# Patient Record
Sex: Male | Born: 1965 | Race: Black or African American | Hispanic: No | Smoking: Current some day smoker
Health system: Southern US, Community
[De-identification: ages and names within clinical notes are randomized; demographics above are authoritative.]

## PROBLEM LIST (undated history)

## (undated) DIAGNOSIS — R079 Chest pain, unspecified: Secondary | ICD-10-CM

## (undated) DIAGNOSIS — W3400XA Accidental discharge from unspecified firearms or gun, initial encounter: Secondary | ICD-10-CM

## (undated) DIAGNOSIS — Z720 Tobacco use: Secondary | ICD-10-CM

## (undated) HISTORY — DX: Tobacco use: Z72.0

## (undated) HISTORY — DX: Chest pain, unspecified: R07.9

---

## 2001-02-01 ENCOUNTER — Emergency Department (HOSPITAL_COMMUNITY): Admission: EM | Admit: 2001-02-01 | Discharge: 2001-02-01 | Payer: Self-pay

## 2001-03-15 ENCOUNTER — Encounter: Payer: Self-pay | Admitting: Emergency Medicine

## 2001-03-15 ENCOUNTER — Emergency Department (HOSPITAL_COMMUNITY): Admission: EM | Admit: 2001-03-15 | Discharge: 2001-03-15 | Payer: Self-pay | Admitting: Emergency Medicine

## 2004-05-13 ENCOUNTER — Emergency Department (HOSPITAL_COMMUNITY): Admission: EM | Admit: 2004-05-13 | Discharge: 2004-05-13 | Payer: Self-pay | Admitting: Emergency Medicine

## 2005-07-21 ENCOUNTER — Inpatient Hospital Stay (HOSPITAL_COMMUNITY): Admission: EM | Admit: 2005-07-21 | Discharge: 2005-07-23 | Payer: Self-pay | Admitting: Family Medicine

## 2005-07-24 ENCOUNTER — Emergency Department (HOSPITAL_COMMUNITY): Admission: EM | Admit: 2005-07-24 | Discharge: 2005-07-24 | Payer: Self-pay | Admitting: Emergency Medicine

## 2005-07-25 ENCOUNTER — Inpatient Hospital Stay (HOSPITAL_COMMUNITY): Admission: AD | Admit: 2005-07-25 | Discharge: 2005-07-27 | Payer: Self-pay | Admitting: Internal Medicine

## 2008-10-22 ENCOUNTER — Emergency Department (HOSPITAL_COMMUNITY): Admission: EM | Admit: 2008-10-22 | Discharge: 2008-10-22 | Payer: Self-pay | Admitting: Family Medicine

## 2009-01-21 ENCOUNTER — Emergency Department (HOSPITAL_COMMUNITY): Admission: EM | Admit: 2009-01-21 | Discharge: 2009-01-21 | Payer: Self-pay | Admitting: Family Medicine

## 2010-09-23 LAB — CULTURE, ROUTINE-ABSCESS

## 2010-11-03 NOTE — Op Note (Signed)
NAMESHLOIMY, MICHALSKI NO.:  [id number]   MEDICAL RECORD NO.:  [id number]          PATIENT TYPE:  INP   LOCATION:  5010                         FACILITY:  [facility]   PHYSICIAN:  Myrtie Neither, MD      DATE OF BIRTH:  1965-08-08   DATE OF PROCEDURE:  07/26/2005  DATE OF DISCHARGE:                                 OPERATIVE REPORT   PREOPERATIVE DIAGNOSIS:  Prepatellar tendon abscess, right knee.   POSTOPERATIVE DIAGNOSIS:  Prepatellar tendon abscess, right knee.   PROCEDURE:  Incision and debridement, abscess, right knee, and placement of  Penrose drain.   SURGEON:  Myrtie Neither, MD   ANESTHESIA:  General.   DESCRIPTION OF PROCEDURE:  The patient was taken to the operating room after  giving adequate preoperative medication and given general anesthesia and  intubated.  The right knee was scrubbed with Betadine scrub and painted with  Betadine solution.  Right knee was draped in a sterile manner.  Anterior  incision was made over the right knee, going through the skin and down  through subcutaneous tissue, down to the abscess.  The prepatellar bursa  synovial lining was completely resected and the abscess thoroughly debrided.  Puncture wound edges were also debrided.  After adequate and thorough  debridement and use of the Simpulse irrigation, wound closure was then done.  A quarter-inch Penrose drain was placed into the wound and wound closure was  done with 2-0 nylon.  Cultures for aerobic and anaerobic organisms were also  done.  The patient tolerated the procedure quite well and went to the  recovery room in stable and satisfactory condition.      Myrtie Neither, MD  Electronically Signed     AC/MEDQ  D:  07/26/2005  T:  07/27/2005  Job:  [id number]

## 2010-11-03 NOTE — Consult Note (Signed)
NAMESAABIR, BLYTH NO.:  [id number]   MEDICAL RECORD NO.:  [id number]          PATIENT TYPE:  INP   LOCATION:  5010                         FACILITY:  [facility]   PHYSICIAN:  Myrtie Neither, MD      DATE OF BIRTH:  1965/09/16   DATE OF CONSULTATION:  07/25/2005  DATE OF DISCHARGE:                                   CONSULTATION   REFERRING PHYSICIAN:  Dr. Virginia Rochester.   REASON FOR CONSULTATION:  Infection, right knee.   PERTINENT HISTORY:  This is a 45 year old black male who works in  [employer] with [employer] and sustained an injury to his right knee about 3  weeks and had gotten some gravel and pebbles into the anterior aspect of his  knee which he did not think very much about.  This gradually became  feverish, swollen and painful.  The patient states he took a needle and  burned the end of the needle with a match and then stuck it into his knee to  drain the fluid out, which subsequently got worse.  The patient has recently  been hospitalized for this infection and found to have MRSA and also has had  felon of the right index finger recently become infected as well.   PAST MEDICAL HISTORY:  No history of high blood pressure or diabetes.   ALLERGIES:  None known.   MEDICATIONS:  Ibuprofen.   SOCIAL HISTORY:  Occasional use of alcohol, no history of use of tobacco or  illicit drugs.   FAMILY HISTORY:  High blood pressure and diabetes.   REVIEW OF SYSTEMS:  That of history of present illness, no cardiac or  respiratory, no urinary or bowel symptoms.   PHYSICAL EXAMINATION:  GENERAL:  Alert and oriented, in no acute distress.  VITAL SIGNS:  Temperature 98.1, pulse 62, respirations 20, blood pressure  140/88.  EXTREMITIES:  Left knee:  Tender, swollen prepatellar bursa with material  exuding from puncture wound anteriorly, dead skin around the periphery of  the area.  Negative Homan's test.   IMAGING STUDIES:  The patient had MRI which demonstrated  also a meniscal  tear as well as loose bodies in the right knee, no evidence of osteomyelitis  so far.   IMPRESSION:  1.  Abscess, right knee, MRSA infection.  2.  Internal derangement with medial meniscal tear and loose bodies, right      knee.   RECOMMENDATION:  Incision and drainage and debridement of patellar tendon  bursa and Penrose drain.  Continue vancomycin IV treatment.  We will address  the meniscal tear after the infection has cleared up.      Myrtie Neither, MD  Electronically Signed     AC/MEDQ  D:  07/26/2005  T:  07/27/2005  Job:  [id number]

## 2010-11-03 NOTE — H&P (Signed)
[name], [name]               ACCOUNT NO.:  [id number]   MEDICAL RECORD NO.:  [id number]          PATIENT TYPE:  INP   LOCATION:  0103                         FACILITY:  [facility]   PHYSICIAN:  Jackie Plum, M.D.DATE OF BIRTH:  1965-10-01   DATE OF ADMISSION:  07/21/2005  DATE OF DISCHARGE:                                HISTORY & PHYSICAL   ADMISSION DIAGNOSES:  A boil on right knee.  The patient presented with 3  week history of right knee boil which had worsened over the last one week  or so .  On presentation to the ED, the ED physicians thought it was  involving the right knee joint, and therefore they direct knee aspiration,  and there was no evidence of any passive drainage or any infected drained  fluid obtained.  The patient was noted to have maximum swelling of the  affected extremity.  Orthopedics was asked to evaluate for admission after  initiation of IV antibiotic.  The patient denies any history of fever or  chills.  He had felt a little bit nauseated yesterday.  No vomiting, no  diarrhea, no headaches, no chest pain, no shortness of breath.  He denies  any previous history of heart disease, diabetes, hypertension.  Does not  have a medical doctor.  He is not allergic to any medication and is not on  any regular medications.  He does not smoke cigarettes.  He drinks alcohol  on a social basis.  Denies any illicit drug use.   FAMILY HISTORY:  Negative for heart disease or diabetes.   SOCIAL HISTORY:  The patient drinks alcohol on a social basis.   Pulmonary history is negative for heart disease or diabetes mellitus.   REVIEW OF SYSTEMS:  As noted above.  Otherwise, unremarkable.   PHYSICAL EXAMINATION:  VITAL SIGNS:  Temperature 97.3, BP 112/68 pulse 62,  respirations 18, O2 saturation 100%.  During the exam, the patient was not  acutely ill-looking.  He was comfortable, no distress from a cardiopulmonary  standpoint.  O2 saturations by pulse oximetry was  100%.  During the exam,  the patient was in moderate pain but not in acute cardiopulmonary distress.  HEENT:  Normocephalic, atraumatic.  Pupils equal, round, and reactive to  light.  Extraocular movements intact.  Oropharynx moist.  NECK:  Supple.  No JVD.  LUNGS:  Clear to auscultation.  CARDIAC:  Regular.  No gallops, no murmur.  ABDOMEN:  Soft, Bowel sounds present .  EXTREMITIES:  No cyanosis.  The patient had a 1.5 cm x 2 cm area involving  the left lower quadrant of the right knee where you could find an open wound  with serous discharge.  The whole of the right lower extremity from the knee  down to the ankle was swollen and erythematous, warm to touch, tender to  palpation.   Lab work, CBC was reviewed.  The patient had leukocytosis of 10,800 with  normal hemoglobin and hematocrit.  Platelet count was 360.  His ESR was  elevated at 23 __________/hr.  Sodium was 137, potassium 3.8, chloride 107,  CO2 26, glucose 99, BUN 8, creatinine 0.9, calcium 8.7.  UA negative for any  urinary infection.  X-ray of the affected extremity involving the joint,  i.e., knee joint was negative for any acute bony abnormality.   IMPRESSION:  Right lower extremity cellulitis.  This may have been an  extension from the previous right knee wound.  On account of the  significance of __________ swelling of the affected extremity with about 3+  pedal pitting edema, we will go ahead and obtain MRI of the extremity to  rule out deep venous thrombosis or any deep-seated infection.  The patient  was started on IV antibiotics and supportive care.      Jackie Plum, M.D.  Electronically Signed     GO/MEDQ  D:  07/21/2005  T:  07/21/2005  Job:  [id number]

## 2010-11-03 NOTE — Discharge Summary (Signed)
[name], [name]               ACCOUNT NO.:  [id number]   MEDICAL RECORD NO.:  [id number]          PATIENT TYPE:  INP   LOCATION:  5010                         FACILITY:  [facility]   PHYSICIAN:  Melissa L. Ladona Ridgel, MD  DATE OF BIRTH:  Nov 04, 1965   DATE OF ADMISSION:  07/25/2005  DATE OF DISCHARGE:  07/27/2005                                 DISCHARGE SUMMARY   ADMITTING DIAGNOSES:  1.  Infection of the right first digit, status post a recent treatment for a      right knee infection.  The patient had been admitted to the hospital      with a right knee infection.  He was discharged to home on Keflex.      Subsequent cultures grew methicillin-resistant Staphylococcus aureus.      He returned to the hospital with a new right first digit infection and      was started on vancomycin and incision and drainage completed by Dr.      Ophelia Charter from [facility], subsequently grew recurrent methicillin-      resistant Staphylococcus aureus.  The wounds were appropriately cared      for; he underwent pulse lavage and continued on his vancomycin.  He was,      at the time of discharge, placed on doxycycline 100 mg p.o. twice daily      and was to follow up with Dr. Ophelia Charter and Dr. Montez Morita as an outpatient.  2.  Infected right knee with continued suppurative discharge.  The patient      initially had been admitted prior to this admission for a right knee      infection.  He was treated with Keflex for cellulitis, but returned with      subsequent suppurative changes.  He was seen by Dr. Montez Morita and underwent      an incision and drainage of the area.  He was evaluated for intravenous      vancomycin at home.  However, it was decided that since the lesions had      been drained, they would be amenable to oral therapy; he therefore was      discharged to home on doxycycline 100 mg p.o. twice daily x2 weeks and      Percocet 5/325 mg for pain.  He was instructed to follow up with      Orthopedics for  removal of the drain and was told to wash his lesions in      Dial soap at least daily.   MEDICATIONS AT TIME OF DISCHARGE:  Medications at the time of discharge as  stated are:  1.  Doxycycline 100 mg p.o. twice daily x2 weeks.  2.  Percocet 5/325 mg one to two tablets p.o. q.6 h.  p.r.n. for pain.   FOLLOWUP:  The patient was instructed to follow up with Orthopedics and with  Dr. Ophelia Charter for reevaluation of the wound.   HISTORY OF PRESENT ILLNESS:  The patient is a [name] who was readmitted to the hospital after a previous recent admission to  the  hospital for knee cellulitis.  The patient returned with a suppurative  right first finger and continued suppurative changes to his right knee.  Cultures reviewed from the previous admission revealed methicillin-resistant  Staph aureus.  The patient had his Keflex discontinued; he was started on  Vancomycin and he had his lesions incised and drained by both Dr. Ophelia Charter and  Dr. Montez Morita from [facility].  The patient's hospital course  was unremarkable.  He tolerated the surgeries well with improvement in the  cellulitic changes.  He was subsequently discharged to home on oral  antibiotics with followup with the outpatient physicians.  On the day of  discharge, he was noted to be hemodynamically stable.   DISCHARGE PHYSICAL EXAM:  VITAL SIGNS:  Afebrile at 97.2, blood pressure  117/63, pulse of 66, respirations were 20 and saturations were 90%.  GENERAL:  He is in no acute distress.  HEENT:  His pupils were equal, round and reactive to light.  Extraocular  muscles were intact.  Mucous membranes were moist.  CHEST:  Clear to auscultation.  CARDIOVASCULAR:  Regular rate and rhythm, positive S1 and S2, no S3 or S4,  no murmurs, rubs, or gallops.  ABDOMEN:  Soft, nontender and non-distended with positive bowel sounds.  EXTREMITIES:  Extremities show an Ace wrap to his right knee and packing and   wrapping pot the right hand, winch were not undressed, but were examined by  the respective surgeons.   DISCHARGE LABORATORY DATA:  His BUN at discharge was 13; his creatinine was  1.0.  His white count at the time of discharge was 7.2.   CONDITION ON DISCHARGE:  At the time of discharge, the patient was deemed  stable to follow up to his respectively surgical physicians.      Melissa L. Ladona Ridgel, MD  Electronically Signed     MLT/MEDQ  D:  09/04/2005  T:  09/06/2005  Job:  [id number]   cc:   Myrtie Neither, MD  Fax: [phone]   Veverly Fells. Ophelia Charter, M.D.  Fax: [phone]

## 2010-11-03 NOTE — Discharge Summary (Signed)
[name], [name]               ACCOUNT NO.:  [id number]   MEDICAL RECORD NO.:  [id number]          PATIENT TYPE:  INP   LOCATION:  1618                         FACILITY:  [facility]   PHYSICIAN:  Jackie Plum, M.D.DATE OF BIRTH:  1966/04/24   DATE OF ADMISSION:  07/21/2005  DATE OF DISCHARGE:                                 DISCHARGE SUMMARY   DISCHARGE DIAGNOSES:  1.  Right lower extremity cellulitis.  2.  Mild right knee abscess.  3.  Questionable meniscal tear. The patient is planned for outpatient follow-      up with Dr. Montez Morita of orthopedics. The patient denies any recent trauma.      He has been ambulating without any significant problems, and this was      based on MRI finding.   DISCHARGE MEDICATIONS:  1.  Keflex 500 mg p.o. t.i.d.  2.  Motrin 800 mg p.o. t.i.d. p.r.n.   CONSULTANTS:  Not applicable.   PROCEDURES:  Not applicable.   CONDITION ON DISCHARGE:  Improved and satisfactory.   DISCHARGE LABORATORY DATA:  Wbc count 7.9, hemoglobin 13.2, hematocrit 40.3,  MCV 89.5, platelet count 385. Sodium 138, potassium 3.9, chloride 106,  glucose 89, BUN 10, creatinine 1.0, calcium 8.9. Joint fluid examination:  WBC 87 per cubic millimeter with 0 neutrophils, 27 lymphocytes, 73  monocytes, no crystals seen. MRI of the lower extremities indicated 3 x 2.5  x 1 cm diffuse subcutaneous edema throughout the right leg and is tender  about the knee. There was only a tiny right knee effusion. There was a  questionable tear of the posterior horn of the medial meniscus and evidence  of some subchondral edema of the posterior aspect of the left tibial plateau  which may be degenerative in origin.   REASON FOR ADMISSION:  Right lower extremity cellulitis.   The patient presented with right lower extremity swelling, pain, and warmth.  He apparently had sore on the right knee joint area prior to this. In the  emergency room the patient was seen by Dr. Devoria Albe who tried to do a  joint  aspiration and the fluid obtained did not reveal any infectious etiology for  any evidence of joint infection. However, in view of the cellulitis,  hospitalist service was asked to evaluate for admission. On admission, the  patient was alert and oriented x3. He had a swollen right lower extremity  from the knee joint down to the ankle with erythema and warm to touch. His  cardiopulmonary exam was unremarkable. His labs indicated mild leukocytosis.  X-ray of the affected extremity did not reveal any bony abnormality. He was  admitted for management of lower extremity cellulitis. He was started on IV  antibiotics. In view of his extensive swelling, we obtained an MRI of the  lower extremities to rule out DVT as well any deep-seated infection and we  obtained cuts involving the knee joint. The results indicated cellulitis  which was confirmed and also there was a questionable meniscal tear as noted  above. The patient denies any recent or any remote trauma to the joint that  he can remember. 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 This MRI report was questionable. On  rounds today the patient was significant for his leukocytosis has resolved,  he does not have significant pain involving the joint, and would like to go  home. After further discussion with the patient, we decided to cancel a  planned inpatient orthopedic evaluation and for him to be seen expeditiously  by orthopedics on-call for unassigned, Dr. Montez Morita, as an outpatient as soon  as possible. He is alert and oriented x3, no acute focal difficulties.  Cardiopulmonary exam unremarkable.   His discharge labs include blood pressure 125/79, pulse 55, respirations 16,  temperature 97.6 degrees Fahrenheit. His right lower extremity swelling and  erythema has decreased remarkably, has improved. His white count is 7.9,  hemoglobin 13.2, hematocrit 40.3, MCV 89.5, platelet  count 385. Sodium 138,  potassium 3.9, chloride 106, CO2 27, glucose 89, BUN 10, creatinine 1.0,  calcium 8.9.   He is going to be discharged home on antibiotics for a total of 10 more days  of treatment. The patient is going to be given an appointment to follow up  with Dr. Montez Morita of orthopedics at which time his MRI report will need to be  reviewed and decision as to whether this so-called questionable meniscal  tear is significant or not will be made. The patient understands the  importance of following up on this appointment. All his questions are  answered. He is being discharged home on p.o. antibiotics as noted above.      Jackie Plum, M.D.  Electronically Signed     GO/MEDQ  D:  07/23/2005  T:  07/23/2005  Job:  [id number]

## 2010-11-03 NOTE — H&P (Signed)
NAMEALONZA, [name] NO.:  [id number]   MEDICAL RECORD NO.:  [id number]          PATIENT TYPE:  EMS   LOCATION:  ED                           [facility]ACILITY:  [facility]   PHYSICIAN:  Hollice Espy, M.D.DATE OF BIRTH:  08/22/1965   DATE OF ADMISSION:  07/24/2005  DATE OF DISCHARGE:                                HISTORY & PHYSICAL   PRIMARY CARE PHYSICIAN:  None.   CONSULTATIONS:  Mark C. Ophelia Charter, M.D., orthopedic surgery.   CHIEF COMPLAINT:  Hand and finger pain.   HISTORY OF PRESENT ILLNESS:  The patient is a 45 year old African-American  male who was just discharged from [facility] service two days ago for  a right lower extremity cellulitis with community-acquired MRSA from a  previous right knee wound. He had been discharged on p.o. Keflex and had  been stable except he started having some problems with increased pain in  his right hand, most specifically his second digit. He said this had been  ongoing since when he had first come in and was suspected likely he had been  scratching at the wound which had caused a further infection of MRSA to his  hand. The patient was having a significant amount of pain and causing some  decreased ability to extend this digit. He denies any headaches, vision  changes, dysphagia, chest pain, palpitations, shortness of breath, wheeze,  cough, abdominal pain, hematuria, dysuria, constipation, diarrhea. He has no  problems with lower extremity pain or weakness or problems with his left  upper extremity.   REVIEW OF SYSTEMS:  Otherwise negative.   PAST MEDICAL HISTORY:  Recent right knee with secondary cellulitis,  community-acquired MRSA.   MEDICATIONS:  The patient had previously been on Keflex 500 milligrams p.o.  t.i.d. and Motrin 800 milligrams p.o. t.i.d.   ALLERGIES:  The patient has no known drug allergies.   SOCIAL HISTORY:  Denies any tobacco, alcohol or drug use.   FAMILY HISTORY:  Noncontributory.   PHYSICAL EXAMINATION:  VITAL SIGNS: On admission, temperature 98.7, heart  rate 82, blood pressure 151/84, respirations 20. O2 saturation 99% on room  air.  GENERAL: The patient is alert and oriented x3 in mild distress secondary to  pain in his right hand.  HEENT: Normocephalic and atraumatic. His moist mucus membranes. He has no  carotid bruits.  HEART: Regular rate and rhythm. S1 and S2.  LUNGS: Clear to auscultation bilaterally.  ABDOMEN: Soft, nontender, and nondistended. Positive bowel sounds.  EXTREMITIES: No clubbing, cyanosis, or edema. His right hand shows some  tenderness with difficulty in extension of his fingers, especially most  notably in the second digit. It had some tenderness and swelling with 1+  pitting edema.   LABORATORY DATA:  White count 10.7. No shift. H&H 13.2 and 40.1. MCV of 90,  platelet count 443,000.   ASSESSMENT/PLAN:  Cellulitis with involvement of the right second digit:  Likely community-acquired methicillin-resistant Staphylococcus aureus. We  will start the patient on IV doxycycline and isolation precautions.  Emergency room has already spoken with Dr. Ophelia Charter of Hand Surgery who will  plan on  seeing the patient in the morning.      Hollice Espy, M.D.  Electronically Signed     SKK/MEDQ  D:  07/25/2005  T:  07/25/2005  Job:  [id number]   cc:   Veverly Fells. Ophelia Charter, M.D.  Fax: [phone]

## 2016-05-29 ENCOUNTER — Emergency Department (HOSPITAL_COMMUNITY): Payer: Self-pay

## 2016-05-29 ENCOUNTER — Inpatient Hospital Stay (HOSPITAL_COMMUNITY)
Admission: EM | Admit: 2016-05-29 | Discharge: 2016-05-31 | DRG: 514 | Disposition: A | Discharge status: Home / Self Care | Payer: Self-pay | Attending: Orthopedic Surgery | Admitting: Orthopedic Surgery

## 2016-05-29 ENCOUNTER — Encounter (HOSPITAL_COMMUNITY): Payer: Self-pay

## 2016-05-29 ENCOUNTER — Encounter (HOSPITAL_COMMUNITY)
Admission: EM | Disposition: A | Discharge status: Home / Self Care | Payer: Self-pay | Source: Home / Self Care | Attending: Orthopedic Surgery

## 2016-05-29 DIAGNOSIS — S61432A Puncture wound without foreign body of left hand, initial encounter: Secondary | ICD-10-CM

## 2016-05-29 DIAGNOSIS — S62391B Other fracture of second metacarpal bone, left hand, initial encounter for open fracture: Principal | ICD-10-CM | POA: Diagnosis present

## 2016-05-29 DIAGNOSIS — Y92009 Unspecified place in unspecified non-institutional (private) residence as the place of occurrence of the external cause: Secondary | ICD-10-CM

## 2016-05-29 DIAGNOSIS — W3400XA Accidental discharge from unspecified firearms or gun, initial encounter: Secondary | ICD-10-CM

## 2016-05-29 HISTORY — PX: PERCUTANEOUS PINNING: SHX2209

## 2016-05-29 HISTORY — PX: I&D EXTREMITY: SHX5045

## 2016-05-29 SURGERY — IRRIGATION AND DEBRIDEMENT EXTREMITY
Anesthesia: General | Site: Hand | Laterality: Left

## 2016-05-29 MED ORDER — CEFAZOLIN IN D5W 1 GM/50ML IV SOLN
1.0000 g | Freq: Once | INTRAVENOUS | Status: AC
  Administered 2016-05-30: 1 g via INTRAVENOUS
  Filled 2016-05-29: qty 50

## 2016-05-29 MED ORDER — FENTANYL CITRATE (PF) 100 MCG/2ML IJ SOLN
INTRAMUSCULAR | Status: AC
Start: 2016-05-29 — End: 2016-05-29
  Filled 2016-05-29: qty 2

## 2016-05-29 MED ORDER — OXYCODONE-ACETAMINOPHEN 5-325 MG PO TABS
1.0000 | ORAL_TABLET | Freq: Once | ORAL | Status: AC
  Administered 2016-05-29: 1 via ORAL
  Filled 2016-05-29: qty 1

## 2016-05-29 MED ORDER — TETANUS-DIPHTH-ACELL PERTUSSIS 5-2.5-18.5 LF-MCG/0.5 IM SUSP
0.5000 mL | Freq: Once | INTRAMUSCULAR | Status: AC
  Administered 2016-05-29: 0.5 mL via INTRAMUSCULAR
  Filled 2016-05-29: qty 0.5

## 2016-05-29 MED ORDER — PROPOFOL 10 MG/ML IV BOLUS
INTRAVENOUS | Status: AC
  Filled 2016-05-29: qty 20

## 2016-05-29 MED ORDER — FENTANYL CITRATE (PF) 100 MCG/2ML IJ SOLN
INTRAMUSCULAR | Status: AC
  Administered 2016-05-29: 100 ug
  Filled 2016-05-29: qty 2

## 2016-05-29 MED ORDER — MIDAZOLAM HCL 2 MG/2ML IJ SOLN
INTRAMUSCULAR | Status: AC
  Filled 2016-05-29: qty 2

## 2016-05-29 SURGICAL SUPPLY — 60 items
APL SKNCLS STERI-STRIP NONHPOA (GAUZE/BANDAGES/DRESSINGS)
BANDAGE ACE 3X5.8 VEL STRL LF (GAUZE/BANDAGES/DRESSINGS) ×2 IMPLANT
BANDAGE ACE 4X5 VEL STRL LF (GAUZE/BANDAGES/DRESSINGS) ×3 IMPLANT
BANDAGE ELASTIC 3 VELCRO ST LF (GAUZE/BANDAGES/DRESSINGS) ×3 IMPLANT
BENZOIN TINCTURE PRP APPL 2/3 (GAUZE/BANDAGES/DRESSINGS) IMPLANT
BLADE SURG ROTATE 9660 (MISCELLANEOUS) IMPLANT
BNDG CONFORM 2 STRL LF (GAUZE/BANDAGES/DRESSINGS) IMPLANT
BNDG GAUZE ELAST 4 BULKY (GAUZE/BANDAGES/DRESSINGS) ×5 IMPLANT
CLOSURE WOUND 1/2 X4 (GAUZE/BANDAGES/DRESSINGS)
CORDS BIPOLAR (ELECTRODE) ×3 IMPLANT
COVER SURGICAL LIGHT HANDLE (MISCELLANEOUS) ×3 IMPLANT
CUFF TOURNIQUET SINGLE 18IN (TOURNIQUET CUFF) ×3 IMPLANT
CUFF TOURNIQUET SINGLE 24IN (TOURNIQUET CUFF) IMPLANT
DRAPE C-ARM MINI 42X72 WSTRAPS (DRAPES) ×2 IMPLANT
DRSG ADAPTIC 3X8 NADH LF (GAUZE/BANDAGES/DRESSINGS) ×3 IMPLANT
DRSG EMULSION OIL 3X3 NADH (GAUZE/BANDAGES/DRESSINGS) IMPLANT
GAUZE SPONGE 4X4 12PLY STRL (GAUZE/BANDAGES/DRESSINGS) ×3 IMPLANT
GAUZE XEROFORM 1X8 LF (GAUZE/BANDAGES/DRESSINGS) ×3 IMPLANT
GLOVE BIOGEL M 8.0 STRL (GLOVE) ×3 IMPLANT
GLOVE SS BIOGEL STRL SZ 8 (GLOVE) ×1 IMPLANT
GLOVE SUPERSENSE BIOGEL SZ 8 (GLOVE) ×2
GOWN STRL REUS W/ TWL LRG LVL3 (GOWN DISPOSABLE) ×2 IMPLANT
GOWN STRL REUS W/ TWL XL LVL3 (GOWN DISPOSABLE) ×2 IMPLANT
GOWN STRL REUS W/TWL LRG LVL3 (GOWN DISPOSABLE) ×6
GOWN STRL REUS W/TWL XL LVL3 (GOWN DISPOSABLE) ×6
HANDPIECE INTERPULSE COAX TIP (DISPOSABLE)
K-WIRE 1.1 (WIRE) ×2 IMPLANT
KIT BASIN OR (CUSTOM PROCEDURE TRAY) ×3 IMPLANT
KIT ROOM TURNOVER OR (KITS) ×3 IMPLANT
MANIFOLD NEPTUNE II (INSTRUMENTS) ×3 IMPLANT
NDL HYPO 25GX1X1/2 BEV (NEEDLE) IMPLANT
NEEDLE HYPO 25GX1X1/2 BEV (NEEDLE) ×3 IMPLANT
NS IRRIG 1000ML POUR BTL (IV SOLUTION) ×3 IMPLANT
PACK ORTHO EXTREMITY (CUSTOM PROCEDURE TRAY) ×3 IMPLANT
PAD ARMBOARD 7.5X6 YLW CONV (MISCELLANEOUS) ×6 IMPLANT
PAD CAST 4YDX4 CTTN HI CHSV (CAST SUPPLIES) ×1 IMPLANT
PADDING CAST ABS 4INX4YD NS (CAST SUPPLIES) ×2
PADDING CAST ABS COTTON 4X4 ST (CAST SUPPLIES) IMPLANT
PADDING CAST COTTON 4X4 STRL (CAST SUPPLIES) ×3
PUTTY DBM STAGRAFT PLUS 5CC (Putty) ×2 IMPLANT
SET CYSTO W/LG BORE CLAMP LF (SET/KITS/TRAYS/PACK) ×2 IMPLANT
SET HNDPC FAN SPRY TIP SCT (DISPOSABLE) IMPLANT
SPLINT FIBERGLASS 4X30 (CAST SUPPLIES) ×2 IMPLANT
SPONGE GAUZE 4X4 12PLY STER LF (GAUZE/BANDAGES/DRESSINGS) ×2 IMPLANT
SPONGE LAP 4X18 X RAY DECT (DISPOSABLE) ×3 IMPLANT
STRIP CLOSURE SKIN 1/2X4 (GAUZE/BANDAGES/DRESSINGS) IMPLANT
SUT CHROMIC 4 0 PS 2 18 (SUTURE) ×2 IMPLANT
SUT ETHILON 4 0 P 3 18 (SUTURE) IMPLANT
SUT ETHILON 5 0 P 3 18 (SUTURE)
SUT NYLON ETHILON 5-0 P-3 1X18 (SUTURE) IMPLANT
SUT PROLENE 4 0 P 3 18 (SUTURE) IMPLANT
SUT PROLENE 4 0 PS 2 18 (SUTURE) ×2 IMPLANT
SYR CONTROL 10ML LL (SYRINGE) ×2 IMPLANT
TOWEL OR 17X24 6PK STRL BLUE (TOWEL DISPOSABLE) ×3 IMPLANT
TOWEL OR 17X26 10 PK STRL BLUE (TOWEL DISPOSABLE) ×3 IMPLANT
TUBE ANAEROBIC SPECIMEN COL (MISCELLANEOUS) IMPLANT
TUBE CONNECTING 12'X1/4 (SUCTIONS) ×1
TUBE CONNECTING 12X1/4 (SUCTIONS) ×2 IMPLANT
WATER STERILE IRR 1000ML POUR (IV SOLUTION) ×3 IMPLANT
YANKAUER SUCT BULB TIP NO VENT (SUCTIONS) ×3 IMPLANT
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
## 2016-05-29 NOTE — ED Notes (Addendum)
Nurse will draw labs. 

## 2016-05-30 ENCOUNTER — Emergency Department (HOSPITAL_COMMUNITY): Payer: Self-pay | Admitting: Certified Registered"

## 2016-05-30 ENCOUNTER — Encounter (HOSPITAL_COMMUNITY): Payer: Self-pay | Admitting: Orthopedic Surgery

## 2016-05-30 DIAGNOSIS — W3400XA Accidental discharge from unspecified firearms or gun, initial encounter: Secondary | ICD-10-CM

## 2016-05-30 DIAGNOSIS — S61432A Puncture wound without foreign body of left hand, initial encounter: Secondary | ICD-10-CM

## 2016-05-30 LAB — CBC WITH DIFFERENTIAL/PLATELET
Basophils Absolute: 0 10*3/uL (ref 0.0–0.1)
Basophils Relative: 0 %
EOS PCT: 0 %
Eosinophils Absolute: 0 10*3/uL (ref 0.0–0.7)
HCT: 36.3 % — ABNORMAL LOW (ref 39.0–52.0)
HEMOGLOBIN: 12.1 g/dL — AB (ref 13.0–17.0)
LYMPHS ABS: 1.7 10*3/uL (ref 0.7–4.0)
LYMPHS PCT: 19 %
MCH: 29.4 pg (ref 26.0–34.0)
MCHC: 33.3 g/dL (ref 30.0–36.0)
MCV: 88.3 fL (ref 78.0–100.0)
MONOS PCT: 4 %
Monocytes Absolute: 0.4 10*3/uL (ref 0.1–1.0)
Neutro Abs: 6.8 10*3/uL (ref 1.7–7.7)
Neutrophils Relative %: 77 %
PLATELETS: 267 10*3/uL (ref 150–400)
RBC: 4.11 MIL/uL — AB (ref 4.22–5.81)
RDW: 14.3 % (ref 11.5–15.5)
WBC: 8.9 10*3/uL (ref 4.0–10.5)

## 2016-05-30 LAB — I-STAT CHEM 8, ED
BUN: 16 mg/dL (ref 6–20)
CHLORIDE: 101 mmol/L (ref 101–111)
Calcium, Ion: 1.13 mmol/L — ABNORMAL LOW (ref 1.15–1.40)
Creatinine, Ser: 0.9 mg/dL (ref 0.61–1.24)
GLUCOSE: 82 mg/dL (ref 65–99)
HCT: 38 % — ABNORMAL LOW (ref 39.0–52.0)
Hemoglobin: 12.9 g/dL — ABNORMAL LOW (ref 13.0–17.0)
POTASSIUM: 3.5 mmol/L (ref 3.5–5.1)
Sodium: 139 mmol/L (ref 135–145)
TCO2: 24 mmol/L (ref 0–100)

## 2016-05-30 MED ORDER — LACTATED RINGERS IV SOLN
INTRAVENOUS | Status: DC
  Administered 2016-05-30: 21:00:00 via INTRAVENOUS
  Administered 2016-05-30: 75 mL/h via INTRAVENOUS

## 2016-05-30 MED ORDER — VITAMIN C 500 MG PO TABS
1000.0000 mg | ORAL_TABLET | Freq: Every day | ORAL | Status: DC
  Administered 2016-05-30: 1000 mg via ORAL
  Filled 2016-05-30: qty 2

## 2016-05-30 MED ORDER — ONDANSETRON HCL 4 MG PO TABS
4.0000 mg | ORAL_TABLET | Freq: Four times a day (QID) | ORAL | Status: DC | PRN
  Administered 2016-05-30 – 2016-05-31 (×2): 4 mg via ORAL
  Filled 2016-05-30 (×2): qty 1

## 2016-05-30 MED ORDER — FENTANYL CITRATE (PF) 100 MCG/2ML IJ SOLN
25.0000 ug | INTRAMUSCULAR | Status: DC | PRN
Start: 2016-05-30 — End: 2016-05-30
  Administered 2016-05-30 (×2): 50 ug via INTRAVENOUS

## 2016-05-30 MED ORDER — DOCUSATE SODIUM 100 MG PO CAPS
100.0000 mg | ORAL_CAPSULE | Freq: Two times a day (BID) | ORAL | Status: DC
  Administered 2016-05-30 (×2): 100 mg via ORAL
  Filled 2016-05-30 (×2): qty 1

## 2016-05-30 MED ORDER — FENTANYL CITRATE (PF) 100 MCG/2ML IJ SOLN
INTRAMUSCULAR | Status: AC
  Filled 2016-05-30: qty 2

## 2016-05-30 MED ORDER — PROPOFOL 10 MG/ML IV BOLUS
INTRAVENOUS | Status: DC | PRN
  Administered 2016-05-30: 150 mg via INTRAVENOUS
  Administered 2016-05-30: 50 mg via INTRAVENOUS

## 2016-05-30 MED ORDER — PROMETHAZINE HCL 25 MG RE SUPP: 12.5000 mg | Freq: Four times a day (QID) | RECTAL | Status: DC | PRN

## 2016-05-30 MED ORDER — FENTANYL CITRATE (PF) 100 MCG/2ML IJ SOLN
INTRAMUSCULAR | Status: DC | PRN
Start: 2016-05-30 — End: 2016-05-30
  Administered 2016-05-30 (×2): 50 ug via INTRAVENOUS

## 2016-05-30 MED ORDER — LIDOCAINE HCL (CARDIAC) 20 MG/ML IV SOLN
INTRAVENOUS | Status: DC | PRN
  Administered 2016-05-30: 60 mg via INTRAVENOUS

## 2016-05-30 MED ORDER — BUPIVACAINE HCL (PF) 0.25 % IJ SOLN
INTRAMUSCULAR | Status: AC
  Filled 2016-05-30: qty 30

## 2016-05-30 MED ORDER — SODIUM CHLORIDE 0.9 % IR SOLN
Status: DC | PRN
  Administered 2016-05-30: 3000 mL

## 2016-05-30 MED ORDER — METHOCARBAMOL 1000 MG/10ML IJ SOLN
500.0000 mg | Freq: Four times a day (QID) | INTRAVENOUS | Status: DC | PRN
  Administered 2016-05-30: 500 mg via INTRAVENOUS
  Filled 2016-05-30 (×2): qty 5

## 2016-05-30 MED ORDER — METHOCARBAMOL 500 MG PO TABS
500.0000 mg | ORAL_TABLET | Freq: Four times a day (QID) | ORAL | Status: DC | PRN
  Administered 2016-05-31: 500 mg via ORAL
  Filled 2016-05-30: qty 1

## 2016-05-30 MED ORDER — ONDANSETRON HCL 4 MG/2ML IJ SOLN
INTRAMUSCULAR | Status: DC | PRN
  Administered 2016-05-30: 4 mg via INTRAVENOUS

## 2016-05-30 MED ORDER — BUPIVACAINE HCL (PF) 0.25 % IJ SOLN: INTRAMUSCULAR | Status: DC | PRN

## 2016-05-30 MED ORDER — LACTATED RINGERS IV SOLN
INTRAVENOUS | Status: DC | PRN
  Administered 2016-05-30: via INTRAVENOUS

## 2016-05-30 MED ORDER — ONDANSETRON HCL 4 MG/2ML IJ SOLN
4.0000 mg | Freq: Four times a day (QID) | INTRAMUSCULAR | Status: DC | PRN
  Administered 2016-05-30: 4 mg via INTRAVENOUS
  Filled 2016-05-30: qty 2

## 2016-05-30 MED ORDER — MIDAZOLAM HCL 5 MG/5ML IJ SOLN
INTRAMUSCULAR | Status: DC | PRN
  Administered 2016-05-30: 2 mg via INTRAVENOUS

## 2016-05-30 MED ORDER — PROMETHAZINE HCL 25 MG/ML IJ SOLN: 6.2500 mg | INTRAMUSCULAR | Status: DC | PRN

## 2016-05-30 MED ORDER — ADULT MULTIVITAMIN W/MINERALS CH
1.0000 | ORAL_TABLET | Freq: Every day | ORAL | Status: DC
  Administered 2016-05-30: 1 via ORAL
  Filled 2016-05-30: qty 1

## 2016-05-30 MED ORDER — MORPHINE SULFATE (PF) 2 MG/ML IV SOLN: 1.0000 mg | INTRAVENOUS | Status: DC | PRN

## 2016-05-30 MED ORDER — OXYCODONE HCL 5 MG PO TABS
5.0000 mg | ORAL_TABLET | ORAL | Status: DC | PRN
  Administered 2016-05-30 – 2016-05-31 (×6): 10 mg via ORAL
  Filled 2016-05-30 (×6): qty 2

## 2016-05-30 MED ORDER — CLINDAMYCIN PHOSPHATE 600 MG/50ML IV SOLN
600.0000 mg | Freq: Three times a day (TID) | INTRAVENOUS | Status: DC
  Administered 2016-05-30 – 2016-05-31 (×4): 600 mg via INTRAVENOUS
  Filled 2016-05-30 (×6): qty 50

## 2016-05-30 MED ORDER — FAMOTIDINE 20 MG PO TABS
20.0000 mg | ORAL_TABLET | Freq: Two times a day (BID) | ORAL | Status: DC | PRN
Start: 2016-05-30 — End: 2016-05-31

## 2016-05-30 MED ORDER — SUCCINYLCHOLINE CHLORIDE 20 MG/ML IJ SOLN
INTRAMUSCULAR | Status: DC | PRN
  Administered 2016-05-30: 100 mg via INTRAVENOUS
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
## 2016-05-30 NOTE — Anesthesia Postprocedure Evaluation (Signed)
Anesthesia Post Note  Patient: [name]  Procedure(s) Performed: Procedure(s) (LRB): IRRIGATION AND DEBRIDEMENT EXTREMITY (Left) PERCUTANEOUS PINNING EXTREMITY (Left)  Patient location during evaluation: PACU Anesthesia Type: General Level of consciousness: awake and alert Pain management: pain level controlled Vital Signs Assessment: post-procedure vital signs reviewed and stable Respiratory status: spontaneous breathing, nonlabored ventilation, respiratory function stable and patient connected to nasal cannula oxygen Cardiovascular status: blood pressure returned to baseline and stable Postop Assessment: no signs of nausea or vomiting Anesthetic complications: no    Last Vitals:  Vitals:   05/30/16 0241 05/30/16 0300  BP: 131/88 130/88  Pulse: 68 73  Resp: 11 14  Temp: 36.6 C 36.6 C    Last Pain:  Vitals:   05/30/16 0300  TempSrc: Oral  PainSc:                  Cecile HearingStephen Edward Turk
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
## 2016-05-30 NOTE — Progress Notes (Signed)
Orthopedic Tech Progress Note Patient Details:  Richardo Priesthomas L Derstine 10/19/1965 [id number]  Ortho Devices Type of Ortho Device: Other (comment) Ortho Device/Splint Location:  arm elevator sling Ortho Device/Splint Interventions: Ordered, Application Pt already had post op splint/cast to lue.  Trinna PostMartinez, Lamona Eimer J 05/30/2016, 2:32 AM

## 2016-05-30 NOTE — Transfer of Care (Signed)
Immediate Anesthesia Transfer of Care Note  Patient: [name]  Procedure(s) Performed: Procedure(s): IRRIGATION AND DEBRIDEMENT EXTREMITY (Left) PERCUTANEOUS PINNING EXTREMITY (Left)  Patient Location: PACU  Anesthesia Type:General  Level of Consciousness: awake, alert  and oriented  Airway & Oxygen Therapy: Patient Spontanous Breathing  Post-op Assessment: Report given to RN and Post -op Vital signs reviewed and stable  Post vital signs: Reviewed and stable  Last Vitals:  Vitals:   05/29/16 2233 05/30/16 0137  BP: 108/64 137/88  Pulse: 70 98  Resp: (!) 27 (!) 21  Temp: 36.7 C 36.6 C    Last Pain:  Vitals:   05/29/16 2329  TempSrc:   PainSc: 10-Worst pain ever         Complications: No apparent anesthesia complications
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
## 2016-05-31 MED ORDER — SULFAMETHOXAZOLE-TRIMETHOPRIM 800-160 MG PO TABS: 1.0000 | ORAL_TABLET | Freq: Two times a day (BID) | ORAL | 0 refills | Status: DC

## 2016-05-31 MED ORDER — PROMETHAZINE HCL 12.5 MG PO TABS: 12.5000 mg | ORAL_TABLET | Freq: Four times a day (QID) | ORAL | 0 refills | Status: DC | PRN

## 2016-05-31 MED ORDER — METHOCARBAMOL 500 MG PO TABS: 500.0000 mg | ORAL_TABLET | Freq: Four times a day (QID) | ORAL | 0 refills | Status: DC | PRN

## 2016-05-31 MED ORDER — HYDROCODONE-ACETAMINOPHEN 5-325 MG PO TABS: 1.0000 | ORAL_TABLET | ORAL | 0 refills | Status: DC | PRN

## 2016-05-31 NOTE — Discharge Instructions (Signed)
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
## 2016-05-31 NOTE — Progress Notes (Addendum)
[name] to be D/C'd Home per MD order.  Discussed prescriptions and follow up appointments with the patient. Prescriptions given to patient, medication list explained in detail. Pt verbalized understanding.    Medication List    TAKE these medications   HYDROcodone-acetaminophen 5-325 MG tablet Commonly known as:  NORCO Take 1 tablet by mouth every 4 (four) hours as needed for moderate pain.   methocarbamol 500 MG tablet Commonly known as:  ROBAXIN Take 1 tablet (500 mg total) by mouth every 6 (six) hours as needed for muscle spasms.   promethazine 12.5 MG tablet Commonly known as:  PHENERGAN Take 1 tablet (12.5 mg total) by mouth every 6 (six) hours as needed for nausea or vomiting.   sulfamethoxazole-trimethoprim 800-160 MG tablet Commonly known as:  BACTRIM DS,SEPTRA DS Take 1 tablet by mouth 2 (two) times daily.       Vitals:   05/30/16 2000 05/31/16 0430  BP: 125/88 124/86  Pulse: (!) 59 (!) 56  Resp: 16 16  Temp: 98.4 F (36.9 C) 98.1 F (36.7 C)    Skin clean, dry and intact without evidence of skin break down, no evidence of skin tears noted. IV catheter discontinued intact. Site without signs and symptoms of complications. Dressing and pressure applied. Pt denies pain at this time. No complaints noted.  An After Visit Summary was printed and given to the patient. Patient escorted via WC, and D/C home via private auto.  Mariann BarterKellie Morse Brueggemann BSN, RN [facility] Phone [phone]

## 2017-03-18 DIAGNOSIS — W3400XA Accidental discharge from unspecified firearms or gun, initial encounter: Secondary | ICD-10-CM

## 2017-03-18 HISTORY — DX: Accidental discharge from unspecified firearms or gun, initial encounter: W34.00XA

## 2018-02-19 ENCOUNTER — Encounter (HOSPITAL_COMMUNITY): Payer: Self-pay | Admitting: Emergency Medicine

## 2018-02-19 ENCOUNTER — Emergency Department (HOSPITAL_COMMUNITY): Payer: Self-pay

## 2018-02-19 ENCOUNTER — Inpatient Hospital Stay (HOSPITAL_COMMUNITY)
Admission: EM | Admit: 2018-02-19 | Discharge: 2018-02-21 | DRG: 384 | Disposition: A | Discharge status: Home / Self Care | Payer: Self-pay | Attending: Cardiology | Admitting: Cardiology

## 2018-02-19 ENCOUNTER — Inpatient Hospital Stay (HOSPITAL_COMMUNITY)
Admission: EM | Disposition: A | Discharge status: Home / Self Care | Payer: Self-pay | Source: Home / Self Care | Attending: Cardiology

## 2018-02-19 ENCOUNTER — Other Ambulatory Visit: Payer: Self-pay

## 2018-02-19 ENCOUNTER — Observation Stay (HOSPITAL_BASED_OUTPATIENT_CLINIC_OR_DEPARTMENT_OTHER): Payer: Self-pay

## 2018-02-19 ENCOUNTER — Observation Stay (HOSPITAL_COMMUNITY): Payer: Self-pay

## 2018-02-19 DIAGNOSIS — R109 Unspecified abdominal pain: Secondary | ICD-10-CM

## 2018-02-19 DIAGNOSIS — I700 Atherosclerosis of aorta: Secondary | ICD-10-CM | POA: Diagnosis present

## 2018-02-19 DIAGNOSIS — R079 Chest pain, unspecified: Secondary | ICD-10-CM

## 2018-02-19 DIAGNOSIS — K259 Gastric ulcer, unspecified as acute or chronic, without hemorrhage or perforation: Principal | ICD-10-CM

## 2018-02-19 DIAGNOSIS — R1013 Epigastric pain: Secondary | ICD-10-CM

## 2018-02-19 DIAGNOSIS — R0789 Other chest pain: Secondary | ICD-10-CM | POA: Diagnosis present

## 2018-02-19 DIAGNOSIS — Z8249 Family history of ischemic heart disease and other diseases of the circulatory system: Secondary | ICD-10-CM

## 2018-02-19 DIAGNOSIS — R195 Other fecal abnormalities: Secondary | ICD-10-CM | POA: Diagnosis present

## 2018-02-19 DIAGNOSIS — R9431 Abnormal electrocardiogram [ECG] [EKG]: Secondary | ICD-10-CM | POA: Diagnosis present

## 2018-02-19 DIAGNOSIS — B9681 Helicobacter pylori [H. pylori] as the cause of diseases classified elsewhere: Secondary | ICD-10-CM | POA: Diagnosis present

## 2018-02-19 DIAGNOSIS — F17290 Nicotine dependence, other tobacco product, uncomplicated: Secondary | ICD-10-CM | POA: Diagnosis present

## 2018-02-19 DIAGNOSIS — K3189 Other diseases of stomach and duodenum: Secondary | ICD-10-CM | POA: Diagnosis present

## 2018-02-19 DIAGNOSIS — K269 Duodenal ulcer, unspecified as acute or chronic, without hemorrhage or perforation: Secondary | ICD-10-CM | POA: Diagnosis present

## 2018-02-19 HISTORY — PX: LEFT HEART CATH AND CORONARY ANGIOGRAPHY: CATH118249

## 2018-02-19 HISTORY — DX: Accidental discharge from unspecified firearms or gun, initial encounter: W34.00XA

## 2018-02-19 LAB — CBC
HCT: 43.9 % (ref 39.0–52.0)
HCT: 43 % (ref 39.0–52.0)
Hemoglobin: 14.3 g/dL (ref 13.0–17.0)
Hemoglobin: 13.9 g/dL (ref 13.0–17.0)
MCH: 30.4 pg (ref 26.0–34.0)
MCH: 30.1 pg (ref 26.0–34.0)
MCHC: 32.6 g/dL (ref 30.0–36.0)
MCHC: 32.3 g/dL (ref 30.0–36.0)
MCV: 93.4 fL (ref 78.0–100.0)
MCV: 93.1 fL (ref 78.0–100.0)
PLATELETS: 330 10*3/uL (ref 150–400)
Platelets: 338 10*3/uL (ref 150–400)
RBC: 4.7 MIL/uL (ref 4.22–5.81)
RBC: 4.62 MIL/uL (ref 4.22–5.81)
RDW: 14.4 % (ref 11.5–15.5)
RDW: 14.6 % (ref 11.5–15.5)
WBC: 6.8 10*3/uL (ref 4.0–10.5)
WBC: 6.8 10*3/uL (ref 4.0–10.5)

## 2018-02-19 LAB — COMPREHENSIVE METABOLIC PANEL
ALT: 12 U/L (ref 0–44)
AST: 24 U/L (ref 15–41)
Albumin: 4 g/dL (ref 3.5–5.0)
Alkaline Phosphatase: 55 U/L (ref 38–126)
Anion gap: 12 (ref 5–15)
BUN: 16 mg/dL (ref 6–20)
CO2: 24 mmol/L (ref 22–32)
Calcium: 9.9 mg/dL (ref 8.9–10.3)
Chloride: 103 mmol/L (ref 98–111)
Creatinine, Ser: 1 mg/dL (ref 0.61–1.24)
GFR calc Af Amer: >60 mL/min (ref >60)
GFR calc non Af Amer: >60 mL/min (ref >60)
Glucose, Bld: 124 mg/dL — ABNORMAL HIGH (ref 70–99)
Potassium: 4 mmol/L (ref 3.5–5.1)
Sodium: 139 mmol/L (ref 135–145)
Total Bilirubin: 0.7 mg/dL (ref 0.3–1.2)
Total Protein: 6.8 g/dL (ref 6.5–8.1)

## 2018-02-19 LAB — URINALYSIS, ROUTINE W REFLEX MICROSCOPIC
Bilirubin Urine: NEGATIVE
Glucose, UA: NEGATIVE mg/dL
Hgb urine dipstick: NEGATIVE
Ketones, ur: NEGATIVE mg/dL
Leukocytes, UA: NEGATIVE
Nitrite: NEGATIVE
Protein, ur: NEGATIVE mg/dL
Specific Gravity, Urine: 1.018 (ref 1.005–1.030)
pH: 6 (ref 5.0–8.0)

## 2018-02-19 LAB — ECHOCARDIOGRAM COMPLETE
Height: 74 in
Weight: 3072 oz

## 2018-02-19 LAB — LIPID PANEL
Cholesterol: 148 mg/dL (ref 0–200)
HDL: 70 mg/dL (ref >40)
LDL Cholesterol: 75 mg/dL (ref 0–99)
Total CHOL/HDL Ratio: 2.1 RATIO
Triglycerides: 15 mg/dL (ref <150)
VLDL: 3 mg/dL (ref 0–40)

## 2018-02-19 LAB — CREATININE, SERUM
Creatinine, Ser: 0.95 mg/dL (ref 0.61–1.24)
GFR calc Af Amer: >60 mL/min (ref >60)
GFR calc non Af Amer: >60 mL/min (ref >60)

## 2018-02-19 LAB — TROPONIN I
TROPONIN I: 0.03 ng/mL — AB (ref <0.03)
Troponin I: <0.03 ng/mL (ref <0.03)

## 2018-02-19 LAB — LIPASE, BLOOD: Lipase: 36 U/L (ref 11–51)

## 2018-02-19 SURGERY — LEFT HEART CATH AND CORONARY ANGIOGRAPHY
Anesthesia: LOCAL

## 2018-02-19 MED ORDER — IOHEXOL 350 MG/ML SOLN
INTRAVENOUS | Status: DC | PRN
  Administered 2018-02-19: 100 mL

## 2018-02-19 MED ORDER — METOPROLOL TARTRATE 5 MG/5ML IV SOLN
5.0000 mg | Freq: Once | INTRAVENOUS | Status: DC
  Filled 2018-02-19: qty 5

## 2018-02-19 MED ORDER — ONDANSETRON HCL 4 MG/2ML IJ SOLN: 4.0000 mg | Freq: Four times a day (QID) | INTRAMUSCULAR | Status: DC | PRN

## 2018-02-19 MED ORDER — ASPIRIN 81 MG PO CHEW
81.0000 mg | CHEWABLE_TABLET | Freq: Every day | ORAL | Status: DC
  Administered 2018-02-20: 81 mg via ORAL
  Filled 2018-02-19: qty 1

## 2018-02-19 MED ORDER — FENTANYL CITRATE (PF) 100 MCG/2ML IJ SOLN
INTRAMUSCULAR | Status: AC
  Filled 2018-02-19: qty 2

## 2018-02-19 MED ORDER — HEPARIN (PORCINE) IN NACL 1000-0.9 UT/500ML-% IV SOLN
INTRAVENOUS | Status: DC | PRN
  Administered 2018-02-19: 500 mL

## 2018-02-19 MED ORDER — HEPARIN SODIUM (PORCINE) 1000 UNIT/ML IJ SOLN
INTRAMUSCULAR | Status: DC | PRN
  Administered 2018-02-19: 3000 [IU] via INTRAVENOUS

## 2018-02-19 MED ORDER — NITROGLYCERIN 0.4 MG SL SUBL
0.4000 mg | SUBLINGUAL_TABLET | SUBLINGUAL | Status: DC | PRN
  Filled 2018-02-19: qty 1

## 2018-02-19 MED ORDER — GI COCKTAIL ~~LOC~~
30.0000 mL | Freq: Once | ORAL | Status: AC
  Administered 2018-02-19: 30 mL via ORAL
  Filled 2018-02-19: qty 30

## 2018-02-19 MED ORDER — ATORVASTATIN CALCIUM 80 MG PO TABS
80.0000 mg | ORAL_TABLET | Freq: Every day | ORAL | Status: DC
  Administered 2018-02-19 – 2018-02-20 (×2): 80 mg via ORAL
  Filled 2018-02-19 (×2): qty 1

## 2018-02-19 MED ORDER — HEPARIN SODIUM (PORCINE) 5000 UNIT/ML IJ SOLN
INTRAMUSCULAR | Status: AC
  Administered 2018-02-19: 4000 [IU]
  Filled 2018-02-19: qty 1

## 2018-02-19 MED ORDER — VERAPAMIL HCL 2.5 MG/ML IV SOLN
INTRAVENOUS | Status: AC
  Filled 2018-02-19: qty 2

## 2018-02-19 MED ORDER — SODIUM CHLORIDE 0.9 % IV SOLN: 250.0000 mL | INTRAVENOUS | Status: DC | PRN

## 2018-02-19 MED ORDER — SODIUM CHLORIDE 0.9% FLUSH: 3.0000 mL | INTRAVENOUS | Status: DC | PRN

## 2018-02-19 MED ORDER — MIDAZOLAM HCL 2 MG/2ML IJ SOLN
INTRAMUSCULAR | Status: AC
  Filled 2018-02-19: qty 2

## 2018-02-19 MED ORDER — MIDAZOLAM HCL 2 MG/2ML IJ SOLN
INTRAMUSCULAR | Status: DC | PRN
  Administered 2018-02-19: 1 mg via INTRAVENOUS

## 2018-02-19 MED ORDER — PANTOPRAZOLE SODIUM 40 MG PO TBEC
40.0000 mg | DELAYED_RELEASE_TABLET | Freq: Every day | ORAL | Status: DC
  Administered 2018-02-19 – 2018-02-20 (×2): 40 mg via ORAL
  Filled 2018-02-19 (×3): qty 1

## 2018-02-19 MED ORDER — HEPARIN BOLUS VIA INFUSION
4000.0000 [IU] | Freq: Once | INTRAVENOUS | Status: DC
  Filled 2018-02-19: qty 4000

## 2018-02-19 MED ORDER — ENOXAPARIN SODIUM 30 MG/0.3ML ~~LOC~~ SOLN
30.0000 mg | SUBCUTANEOUS | Status: DC
  Administered 2018-02-20: 30 mg via SUBCUTANEOUS
  Filled 2018-02-19: qty 0.3

## 2018-02-19 MED ORDER — HEPARIN SODIUM (PORCINE) 1000 UNIT/ML IJ SOLN
INTRAMUSCULAR | Status: AC
  Filled 2018-02-19: qty 1

## 2018-02-19 MED ORDER — HEPARIN (PORCINE) IN NACL 1000-0.9 UT/500ML-% IV SOLN
INTRAVENOUS | Status: AC
  Filled 2018-02-19: qty 1000

## 2018-02-19 MED ORDER — SODIUM CHLORIDE 0.9% FLUSH
3.0000 mL | Freq: Two times a day (BID) | INTRAVENOUS | Status: DC
  Administered 2018-02-19 – 2018-02-20 (×3): 3 mL via INTRAVENOUS

## 2018-02-19 MED ORDER — HEPARIN (PORCINE) IN NACL 100-0.45 UNIT/ML-% IJ SOLN
1100.0000 [IU]/h | INTRAMUSCULAR | Status: DC
  Filled 2018-02-19: qty 250

## 2018-02-19 MED ORDER — FENTANYL CITRATE (PF) 100 MCG/2ML IJ SOLN
INTRAMUSCULAR | Status: DC | PRN
  Administered 2018-02-19 (×2): 25 ug via INTRAVENOUS

## 2018-02-19 MED ORDER — ACETAMINOPHEN 325 MG PO TABS
650.0000 mg | ORAL_TABLET | ORAL | Status: DC | PRN
  Administered 2018-02-20 (×2): 650 mg via ORAL
  Filled 2018-02-19 (×3): qty 2

## 2018-02-19 MED ORDER — LIDOCAINE HCL (PF) 1 % IJ SOLN
INTRAMUSCULAR | Status: AC
  Filled 2018-02-19: qty 30

## 2018-02-19 MED ORDER — MORPHINE SULFATE (PF) 2 MG/ML IV SOLN
2.0000 mg | Freq: Once | INTRAVENOUS | Status: AC
  Administered 2018-02-19: 2 mg via INTRAVENOUS
  Filled 2018-02-19: qty 1

## 2018-02-19 MED ORDER — LIDOCAINE HCL (PF) 1 % IJ SOLN
INTRAMUSCULAR | Status: DC | PRN
  Administered 2018-02-19: 2 mL

## 2018-02-19 MED ORDER — ASPIRIN 81 MG PO CHEW
324.0000 mg | CHEWABLE_TABLET | Freq: Once | ORAL | Status: AC
  Administered 2018-02-19: 324 mg via ORAL
  Filled 2018-02-19: qty 4

## 2018-02-19 MED ORDER — SODIUM CHLORIDE 0.9 % WEIGHT BASED INFUSION: 1.0000 mL/kg/h | INTRAVENOUS | Status: AC

## 2018-02-19 MED ORDER — VERAPAMIL HCL 2.5 MG/ML IV SOLN
INTRAVENOUS | Status: DC | PRN
  Administered 2018-02-19 (×2): 10 mL via INTRA_ARTERIAL

## 2018-02-19 SURGICAL SUPPLY — 16 items
CATH INFINITI 5FR ANG PIGTAIL (CATHETERS) ×1 IMPLANT
CATH INFINITI JR4 5F (CATHETERS) ×1 IMPLANT
CATH LAUNCHER 5F EBU3.5 (CATHETERS) ×1 IMPLANT
CATH VISTA GUIDE 6FR XBLAD3.5 (CATHETERS) ×1 IMPLANT
DEVICE RAD COMP TR BAND LRG (VASCULAR PRODUCTS) ×1 IMPLANT
ELECT DEFIB PAD ADLT CADENCE (PAD) ×1 IMPLANT
GLIDESHEATH SLEND SS 6F .021 (SHEATH) ×1 IMPLANT
GUIDEWIRE INQWIRE 1.5J.035X260 (WIRE) IMPLANT
INQWIRE 1.5J .035X260CM (WIRE) ×2
KIT ENCORE 26 ADVANTAGE (KITS) ×1 IMPLANT
KIT HEART LEFT (KITS) ×2 IMPLANT
PACK CARDIAC CATHETERIZATION (CUSTOM PROCEDURE TRAY) ×2 IMPLANT
SYR MEDRAD MARK V 150ML (SYRINGE) ×2 IMPLANT
TRANSDUCER W/STOPCOCK (MISCELLANEOUS) ×2 IMPLANT
TUBING CIL FLEX 10 FLL-RA (TUBING) ×2 IMPLANT
WIRE ASAHI PROWATER 180CM (WIRE) ×1 IMPLANT

## 2018-02-19 NOTE — H&P (Signed)
History & Physical    Patient ID: [name] MRN: [id number], DOB/AGE: 1965/08/11   Admit date: 02/19/2018    Primary Physician: No primary care provider on file. Primary Cardiologist: New  Patient Profile    52 yo male with no cardiac hx who presented with chest pain off and on for a month, but worsened last night around 6pm. Continued through the night and presented to the ED this morning, Code STEMI called.   Past Medical History    History reviewed. No pertinent past medical history.  Past Surgical History:  Procedure Laterality Date  . I&D EXTREMITY Left 05/29/2016   Procedure: IRRIGATION AND DEBRIDEMENT EXTREMITY;  Surgeon: Dominica Severin, MD;  Location: [facility] OR;  Service: Orthopedics;  Laterality: Left;  . PERCUTANEOUS PINNING Left 05/29/2016   Procedure: PERCUTANEOUS PINNING EXTREMITY;  Surgeon: Dominica Severin, MD;  Location: [facility] OR;  Service: Orthopedics;  Laterality: Left;     Allergies  No Known Allergies  History of Present Illness    [name] is a 52 yo male with no prior cardiac hx. Smokes cigars. No reported family hx of CAD. 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 Has been having intermittent episodes of chest pain for about a month. Last night around 6pm developed centralized chest pain with nausea, shortness of breath and diaphoresis. Symptoms lingered throughout the evening and he presented to the ED this morning.   In the ED his initial EKG showed ST elevation in lead I, aVL, and v2 with reciprocal changes in inferior leads. Repeat EKG showed continuation of changes. EDP asked cardiology to evaluate EKG and Code STEMI was called. He was given 324mg  ASA and 4000 units of heparin.Seen by Dr. [place] in the ED and brought to the cath lab for emergent cardiac cath.   Home Medications    Prior to Admission medications   Medication Sig Start Date End Date Taking? Authorizing Provider  HYDROcodone-acetaminophen (NORCO) 5-325 MG tablet  Take 1 tablet by mouth every 4 (four) hours as needed for moderate pain. 05/31/16   Karie Chimera, PA-C  methocarbamol (ROBAXIN) 500 MG tablet Take 1 tablet (500 mg total) by mouth every 6 (six) hours as needed for muscle spasms. 05/31/16   Karie Chimera, PA-C  promethazine (PHENERGAN) 12.5 MG tablet Take 1 tablet (12.5 mg total) by mouth every 6 (six) hours as needed for nausea or vomiting. 05/31/16   Karie Chimera, PA-C  sulfamethoxazole-trimethoprim (BACTRIM DS,SEPTRA DS) 800-160 MG tablet Take 1 tablet by mouth 2 (two) times daily. 05/31/16   Karie Chimera, PA-C    Family History    Family History  Problem Relation Age of Onset  . Hypertension Father     Social History    Social History   Socioeconomic History  . Marital status: Divorced    Spouse name: Not on file  . Number of children: Not on file  . Years of education: Not on file  . Highest education level: Not on file  Occupational History  . Not on file  Social Needs  . Financial resource strain: Not on file  . Food insecurity:    Worry: Not on file    Inability: Not on file  . Transportation needs:    Medical: Not on file    Non-medical: Not on file  Tobacco Use  . Smoking status: Never Smoker  . Smokeless tobacco: Never Used  Substance and Sexual Activity  . Alcohol use: Yes    Comment: occ  . Drug  use: Never  . Sexual activity: Not on file  Lifestyle  . Physical activity:    Days per week: Not on file    Minutes per session: Not on file  . Stress: Not on file  Relationships  . Social connections:    Talks on phone: Not on file    Gets together: Not on file    Attends religious service: Not on file    Active member of club or organization: Not on file    Attends meetings of clubs or organizations: Not on file    Relationship status: Not on file  . Intimate partner violence:    Fear of current or ex partner: Not on file    Emotionally abused: Not on file    Physically abused: Not on file     Forced sexual activity: Not on file  Other Topics Concern  . Not on file  Social History Narrative  . Not on file     Review of Systems    See HPI  All other systems reviewed and are otherwise negative except as noted above.  Physical Exam    Blood pressure (!) 122/108, pulse (!) 59, temperature 97.6 F (36.4 C), temperature source Oral, resp. rate 20, height 6\' 2"  (1.88 m), weight 87.1 kg, SpO2 100 %.  General: [name], NAD Psych: Normal affect. Neuro: Alert and oriented X 3. Moves all extremities spontaneously. HEENT: Normal  Neck: Supple without bruits or JVD. Lungs:  Resp regular and unlabored, CTA. Heart: RRR no s3, s4, or murmurs. Abdomen: Soft, non-tender, non-distended, BS + x 4.  Extremities: No clubbing, cyanosis or edema. DP/PT/Radials 2+ and equal bilaterally.  Labs    Troponin (Point of Care Test) No results for input(s): TROPIPOC in the last 72 hours. No results for input(s): CKTOTAL, CKMB, TROPONINI in the last 72 hours. Lab Results  Component Value Date   WBC 6.8 02/19/2018   HGB 14.3 02/19/2018   HCT 43.9 02/19/2018   MCV 93.4 02/19/2018   PLT 338 02/19/2018    Recent Labs  Lab 02/19/18 0738  NA 139  K 4.0  CL 103  CO2 24  BUN 16  CREATININE 1.00  CALCIUM 9.9  PROT 6.8  BILITOT 0.7  ALKPHOS 55  ALT 12  AST 24  GLUCOSE 124*   No results found for: CHOL, HDL, LDLCALC, TRIG No results found for: [facility]   Radiology Studies    No results found.  ECG & Cardiac Imaging    EKG: SR with ST elevation in lead I, aVL and v2  Assessment & Plan    52 yo male with no cardiac hx who presented with chest pain off and on for a month, but worsened last night around 6pm. Continued through the night and presented to the ED this morning, Code STEMI called.   Abnormal EKG concerning for STEMI: Reports ongoing symptoms for about a month, but worsened last night around 6pm and lingered through the evening. Presented to the ED this morning with  symptoms. Given 324mg  ASA and 4000 units of heparin in the ED. Brought to the cath emergently for cardiac cath. Further recommendations post cath.    Severity of Illness: The appropriate patient status for this patient is INPATIENT. Inpatient status is judged to be reasonable and necessary in order to provide the required intensity of service to ensure the patient's safety. The patient's presenting symptoms, physical exam findings, and initial radiographic and laboratory data in the context of their chronic comorbidities  is felt to place them at high risk for further clinical deterioration. Furthermore, it is not anticipated that the patient will be medically stable for discharge from the hospital within 2 midnights of admission. The following factors support the patient status of inpatient.   " The patient's presenting symptoms include chest pain, shortness of breath, nausea. " The worrisome physical exam findings include diaphoretic. " The initial radiographic and laboratory data are worrisome because of EKG concerning for STEMI. " The chronic co-morbidities include cigar use.  * I certify that at the point of admission it is my clinical judgment that the patient will require inpatient hospital care spanning beyond 2 midnights from the point of admission due to high intensity of service, high risk for further deterioration and high frequency of surveillance required.*   Signed, Laverda Page, NP-C Pager [phone] 02/19/2018, 8:38 AM

## 2018-02-19 NOTE — Progress Notes (Signed)
Pt pain has improved- but still 7 out of 10 pain, but is a dull throbbing pain.  Paged MD  Cardiology PA returned call will place order for morphine and abd x-ray.   Also made aware of radial site  Is still bleeding small amount.  Per PA pt had a lot of heparin during procedure.  Continue to monitor.
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
## 2018-02-19 NOTE — ED Triage Notes (Signed)
Patient to ED c/o upper abdominal pain x 3 weeks, intermittent initially, but constant since last night with nausea and chills. States pain feels like cramping and like he needs to have a bowel movement but doesn't. Denies shortness of breath, dizziness. Resp e/u, skin warm/dry.

## 2018-02-19 NOTE — Progress Notes (Signed)
Still some bleeding after removing air from TR band.   Placed 2 cc in after 1st episode (total 7 cc).  Will continue to monitor.  Otherwise no pain, tingling sensation is gone.

## 2018-02-19 NOTE — Progress Notes (Addendum)
ANTICOAGULATION CONSULT NOTE - Initial Consult  Pharmacy Consult for heparin Indication: chest pain/ACS  No Known Allergies  Patient Measurements: Height: 6\' 2"  (188 cm) Weight: 192 lb (87.1 kg) IBW/kg (Calculated) : 82.2 Heparin Dosing Weight: 87.1kg  Vital Signs: Temp: 97.6 F (36.4 C) (09/04 0735) Temp Source: Oral (09/04 0735) BP: 123/84 (09/04 0723) Pulse Rate: 65 (09/04 0723)  Labs: Recent Labs    02/19/18 0738  HGB 14.3  HCT 43.9  PLT 338    CrCl cannot be calculated (Patient's most recent lab result is older than the maximum 21 days allowed.).   Medical History: History reviewed. No pertinent past medical history.  Medications:  Infusions:  . heparin      Assessment: 52 yom presented to the ED with abdominal pain. EKG with changed and different from previous per MD assessment. To start IV heparin for r/o ACS. Baseline CBC is WNL and he is not on anticoagulation PTA.   Goal of Therapy:  Heparin level 0.3-0.7 units/ml Monitor platelets by anticoagulation protocol: Yes   Plan:  Heparin bolus 4000 units IV x 1 Heparin gtt 1100 units/hr Check a 6 hr heparin level Daily heparin level and CBC  Addendum: Code STEMI called, pt received 4000 units of heparin prior to going to the cath lab. Will follow-up post-cath.   Melissa Pulido, Drake Leach 02/19/2018,8:14 AM

## 2018-02-19 NOTE — Progress Notes (Signed)
Responded to Code STEMI. Checked in with the nurse. The code was cancelled and the patient was moved to [facility].    02/19/18 0814  Clinical Encounter Type  Visited With Patient and family together  Visit Type Spiritual support  Referral From Nurse  Consult/Referral To Chaplain  Stress Factors  Patient Stress Factors Exhausted  Family Stress Factors Exhausted

## 2018-02-19 NOTE — Progress Notes (Signed)
Small amount of bleeding from band-  placed 2 cc of air back-   Does not seem to be bleeding any more.  Will wait to take some more air out and continue to monitor

## 2018-02-19 NOTE — Progress Notes (Signed)
Patient had a cirtical troponin of 0.03. MD has been made aware. Will continue to monitor.   Elsie Lincoln, RN

## 2018-02-19 NOTE — Progress Notes (Signed)
Patient reporting 10 out of 10 epigastric pain.  Paged MD  MD to placed GI cocktail

## 2018-02-19 NOTE — Progress Notes (Signed)
After removing all the air out of TR band-  Small amount of blood noticed on gauze.   Place 2 cc in TR band  Recheck 20 minutes later- still looks like some bleeding from site.  Place 2 more cc

## 2018-02-19 NOTE — Progress Notes (Signed)
*  PRELIMINARY RESULTS* Echocardiogram 2D Echocardiogram has been performed.  Jeryl Columbia 02/19/2018, 2:21 PM

## 2018-02-20 ENCOUNTER — Observation Stay (HOSPITAL_COMMUNITY): Payer: Self-pay

## 2018-02-20 LAB — GLUCOSE, CAPILLARY
GLUCOSE-CAPILLARY: 128 mg/dL — AB (ref 70–99)
GLUCOSE-CAPILLARY: 78 mg/dL (ref 70–99)
Glucose-Capillary: 97 mg/dL (ref 70–99)
Glucose-Capillary: 94 mg/dL (ref 70–99)

## 2018-02-20 LAB — CBC
HEMATOCRIT: 42.3 % (ref 39.0–52.0)
HEMOGLOBIN: 13.7 g/dL (ref 13.0–17.0)
MCH: 29.6 pg (ref 26.0–34.0)
MCHC: 32.4 g/dL (ref 30.0–36.0)
MCV: 91.4 fL (ref 78.0–100.0)
Platelets: 304 10*3/uL (ref 150–400)
RBC: 4.63 MIL/uL (ref 4.22–5.81)
RDW: 14.4 % (ref 11.5–15.5)
WBC: 5.6 10*3/uL (ref 4.0–10.5)

## 2018-02-20 LAB — BASIC METABOLIC PANEL
ANION GAP: 6 (ref 5–15)
BUN: 14 mg/dL (ref 6–20)
CHLORIDE: 107 mmol/L (ref 98–111)
CO2: 25 mmol/L (ref 22–32)
Calcium: 8.8 mg/dL — ABNORMAL LOW (ref 8.9–10.3)
Creatinine, Ser: 1.01 mg/dL (ref 0.61–1.24)
GFR calc Af Amer: >60 mL/min (ref >60)
GFR calc non Af Amer: >60 mL/min (ref >60)
Glucose, Bld: 86 mg/dL (ref 70–99)
Potassium: 3.7 mmol/L (ref 3.5–5.1)
Sodium: 138 mmol/L (ref 135–145)

## 2018-02-20 MED ORDER — OXYCODONE HCL 5 MG PO TABS
5.0000 mg | ORAL_TABLET | Freq: Four times a day (QID) | ORAL | Status: DC | PRN
  Administered 2018-02-20: 5 mg via ORAL
  Filled 2018-02-20: qty 1

## 2018-02-20 MED ORDER — SODIUM CHLORIDE 0.9 % IV SOLN
INTRAVENOUS | Status: DC
  Administered 2018-02-20: 21:00:00 via INTRAVENOUS

## 2018-02-20 MED ORDER — OXYCODONE HCL 5 MG PO TABS
5.0000 mg | ORAL_TABLET | Freq: Once | ORAL | Status: AC
  Administered 2018-02-20: 5 mg via ORAL
  Filled 2018-02-20: qty 1
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
## 2018-02-20 NOTE — Progress Notes (Signed)
Progress Note  Patient Name: [name] Date of Encounter: 02/20/2018  Primary Cardiologist: Dr. Peter Swaziland  Subjective   [name] is postop day 1 urgent cardiac catheterization for "STEMI" with normal coronary arteries a cath and normal LV function.  Enzymes were negative.  His pain was more epigastric and I suspect his pain is gastrointestinal.  Inpatient Medications    Scheduled Meds: . aspirin  81 mg Oral Daily  . atorvastatin  80 mg Oral q1800  . enoxaparin (LOVENOX) injection  30 mg Subcutaneous Q24H  . pantoprazole  40 mg Oral Daily  . sodium chloride flush  3 mL Intravenous Q12H   Continuous Infusions: . sodium chloride     PRN Meds: sodium chloride, acetaminophen, nitroGLYCERIN, ondansetron (ZOFRAN) IV, sodium chloride flush   Vital Signs    Vitals:   02/19/18 1954 02/20/18 0114 02/20/18 0627 02/20/18 0906  BP: (!) 125/94 125/85 122/84 129/90  Pulse: (!) 57 (!) 56 (!) 51 (!) 56  Resp: 19 19 19 18   Temp: 98.2 F (36.8 C) 98.1 F (36.7 C) 98 F (36.7 C) 98.7 F (37.1 C)  TempSrc: Oral Oral Oral Oral  SpO2: 100% 100% 100% 100%  Weight:   82.4 kg   Height:        Intake/Output Summary (Last 24 hours) at 02/20/2018 1225 Last data filed at 02/20/2018 1141 Gross per 24 hour  Intake 1565.6 ml  Output 1425 ml  Net 140.6 ml   Filed Weights   02/19/18 0736 02/19/18 1750 02/20/18 0627  Weight: 87.1 kg 83.1 kg 82.4 kg    Telemetry    Normal sinus rhythm- Personally Reviewed  ECG    Sinus bradycardia 52 with inferior T wave inversion in septal Q waves- Personally Reviewed  Physical Exam   GEN: No acute distress.   Neck: No JVD Cardiac: RRR, no murmurs, rubs, or gallops.  Respiratory: Clear to auscultation bilaterally. GI: Soft, nontender, non-distended  MS: No edema; No deformity. Neuro:  Nonfocal  Psych: Normal affect   Labs    Chemistry Recent Labs  Lab 02/19/18 0738 02/19/18 0942 02/20/18 0419  NA 139  --  138  K 4.0  --  3.7    CL 103  --  107  CO2 24  --  25  GLUCOSE 124*  --  86  BUN 16  --  14  CREATININE 1.00 0.95 1.01  CALCIUM 9.9  --  8.8*  PROT 6.8  --   --   ALBUMIN 4.0  --   --   AST 24  --   --   ALT 12  --   --   ALKPHOS 55  --   --   BILITOT 0.7  --   --   GFRNONAA >60 >60 >60  GFRAA >60 >60 >60  ANIONGAP 12  --  6     Hematology Recent Labs  Lab 02/19/18 0738 02/19/18 0942 02/20/18 0419  WBC 6.8 6.8 5.6  RBC 4.70 4.62 4.63  HGB 14.3 13.9 13.7  HCT 43.9 43.0 42.3  MCV 93.4 93.1 91.4  MCH 30.4 30.1 29.6  MCHC 32.6 32.3 32.4  RDW 14.4 14.6 14.4  PLT 338 330 304    Cardiac Enzymes Recent Labs  Lab 02/19/18 0942 02/19/18 1524 02/19/18 2035  TROPONINI <0.03 <0.03 0.03*   No results for input(s): TROPIPOC in the last 168 hours.   BNPNo results for input(s): BNP, PROBNP in the last 168 hours.   DDimer No  results for input(s): DDIMER in the last 168 hours.   Radiology    Dg Abd 1 View  Result Date: 02/19/2018 CLINICAL DATA:  Upper abdominal pain EXAM: ABDOMEN - 1 VIEW COMPARISON:  None. FINDINGS: Nonobstructed bowel-gas pattern with mild stool. No abnormal calcifications. Spurring or old fracture deformity at the left pubic symphysis. IMPRESSION: Nonobstructed gas pattern Electronically Signed   By: Jasmine Pang M.D.   On: 02/19/2018 18:40   Dg Chest Port 1 View  Result Date: 02/19/2018 CLINICAL DATA:  Upper abdominal region pain EXAM: PORTABLE CHEST 1 VIEW COMPARISON:  None. FINDINGS: There is no edema or consolidation. The heart size and pulmonary vascularity are normal. No adenopathy. There is aortic atherosclerosis. No evident bone lesions. No pneumothorax. IMPRESSION: No edema or consolidation. Heart size normal. There is aortic atherosclerosis. Aortic Atherosclerosis (ICD10-I70.0). Electronically Signed   By: Bretta Bang III M.D.   On: 02/19/2018 08:37    Cardiac Studies   2D echocardiogram (02/19/2018)  Study Conclusions  - Left ventricle: The cavity size was  normal. There was mild   concentric hypertrophy. Systolic function was normal. The   estimated ejection fraction was in the range of 60% to 65%. Wall   motion was normal; there were no regional wall motion   abnormalities. Left ventricular diastolic function parameters   were normal. - Aortic valve: There was no significant regurgitation. - Mitral valve: There was trivial regurgitation. - Atrial septum: No defect or patent foramen ovale was identified. - Tricuspid valve: There was trivial regurgitation. - Pulmonic valve: There was no significant regurgitation.  Impressions:  - Normal LV systolic and diastolic function without wall motion   abnormalities. No significant valve disease  Cardiac catheterization (02/19/2018)   Mid LAD lesion is 25% stenosed.  The left ventricular systolic function is normal.  LV end diastolic pressure is normal.  The left ventricular ejection fraction is 55-65% by visual estimate.   1. Minor nonobstructive CAD 2. Normal LV function 3. Normal LVEDP  Plan: Check Echo. Cycle troponins. No evidence of acute lesion on cardiac cath. Start PPI. Consider noncardiac causes of chest pain.  Recommend Aspirin 81mg  daily for moderate CAD.  Patient Profile     52 y.o. male admitted with epigastric pain and abnormal EKG.  He is brought to the Cath Lab urgently and underwent cardiac catheterization radially by Dr. Swaziland revealing normal coronaries and normal LV function.  His troponins were negative.  He does complain of epigastric pain which may be gastrointestinal in nature.  Assessment & Plan    1: Epigastric pain- patient had epigastric pain rating to his chest with an abnormal EKG that led to heart cath by Dr. Swaziland radially that was negative.  He will need a thorough GI work-up including abdominal CT and EGD.  A GI consult will be obtained today.  2: Tobacco abuse- encouraged smoking cessation.  For questions or updates, please contact [facility] Please consult [email or web address] for contact info under Cardiology/STEMI.      Signed, Nanetta Batty, MD  02/20/2018, 12:25 PM

## 2018-02-20 NOTE — Care Management Note (Signed)
Case Management Note  Patient Details  Name: [name] MRN: [id number] Date of Birth: 1966-04-29  Subjective/Objective:    Acute Chest Pain               Action/Plan: Patient is independent of his ADL's; No PCP, no medical insurance; Financial Counselor in to screen patient to see what he might qualify for. He has applied for Disability, SSI; He is agreeable to go to the [facility] for follow up care. CM will continue to follow for progression of care.  Expected Discharge Date:    possibly 02/20/2018              Expected Discharge Plan:  Home/Self Care  In-House Referral:   Financial Counselor  Discharge planning Services  CM Consult  Status of Service:  In process, will continue to follow  Reola Mosher [phone] 02/20/2018, 10:43 AM

## 2018-02-21 ENCOUNTER — Observation Stay (HOSPITAL_COMMUNITY): Payer: Self-pay | Admitting: Certified Registered Nurse Anesthetist

## 2018-02-21 ENCOUNTER — Encounter (HOSPITAL_COMMUNITY): Payer: Self-pay | Admitting: Certified Registered Nurse Anesthetist

## 2018-02-21 ENCOUNTER — Encounter (HOSPITAL_COMMUNITY)
Admission: EM | Disposition: A | Discharge status: Home / Self Care | Payer: Self-pay | Source: Home / Self Care | Attending: Cardiology

## 2018-02-21 DIAGNOSIS — K259 Gastric ulcer, unspecified as acute or chronic, without hemorrhage or perforation: Secondary | ICD-10-CM

## 2018-02-21 HISTORY — PX: BIOPSY: SHX5522

## 2018-02-21 HISTORY — PX: ESOPHAGOGASTRODUODENOSCOPY (EGD) WITH PROPOFOL: SHX5813

## 2018-02-21 LAB — GLUCOSE, CAPILLARY
GLUCOSE-CAPILLARY: 76 mg/dL (ref 70–99)
Glucose-Capillary: 75 mg/dL (ref 70–99)

## 2018-02-21 SURGERY — ESOPHAGOGASTRODUODENOSCOPY (EGD) WITH PROPOFOL
Anesthesia: Monitor Anesthesia Care

## 2018-02-21 MED ORDER — PROPOFOL 10 MG/ML IV BOLUS
INTRAVENOUS | Status: DC | PRN
  Administered 2018-02-21: 20 mg via INTRAVENOUS

## 2018-02-21 MED ORDER — PROPOFOL 500 MG/50ML IV EMUL
INTRAVENOUS | Status: DC | PRN
  Administered 2018-02-21: 100 ug/kg/min via INTRAVENOUS

## 2018-02-21 MED ORDER — LACTATED RINGERS IV SOLN
INTRAVENOUS | Status: DC | PRN
  Administered 2018-02-21: 11:00:00 via INTRAVENOUS

## 2018-02-21 MED ORDER — ASPIRIN 81 MG PO CHEW: 81.0000 mg | CHEWABLE_TABLET | Freq: Every day | ORAL | Status: AC

## 2018-02-21 MED ORDER — LACTATED RINGERS IV SOLN: INTRAVENOUS | Status: DC

## 2018-02-21 MED ORDER — PANTOPRAZOLE SODIUM 40 MG PO TBEC: 40.0000 mg | DELAYED_RELEASE_TABLET | Freq: Two times a day (BID) | ORAL | 1 refills | Status: DC

## 2018-02-21 SURGICAL SUPPLY — 15 items

## 2018-02-21 NOTE — Brief Op Note (Signed)
02/19/2018 - 02/21/2018  11:24 AM  PATIENT:  Milana Obey  52 y.o. male  PRE-OPERATIVE DIAGNOSIS:  epigastric pain  POST-OPERATIVE DIAGNOSIS:  gastric ulcers, duodenal ulcers  PROCEDURE:  Procedure(s): ESOPHAGOGASTRODUODENOSCOPY (EGD) WITH PROPOFOL (N/A)  SURGEON:  Surgeon(s) and Role:    Ronnette Juniper, MD - Primary  PHYSICIAN ASSISTANT:   ASSISTANTS: Burtis Junes, RN, Janie Billups, Tech   ANESTHESIA:   MAC  EBL:  Minimal  BLOOD ADMINISTERED:none  DRAINS: none   LOCAL MEDICATIONS USED:  NONE  SPECIMEN:  Biopsy / Limited Resection  DISPOSITION OF SPECIMEN:  PATHOLOGY  COUNTS:  YES  TOURNIQUET:  * No tourniquets in log *  DICTATION: .Dragon Dictation  PLAN OF CARE: Admit to inpatient   PATIENT DISPOSITION:  PACU - hemodynamically stable.   Delay start of Pharmacological VTE agent (>24hrs) due to surgical blood loss or risk of bleeding: no

## 2018-02-21 NOTE — Anesthesia Preprocedure Evaluation (Addendum)
Anesthesia Evaluation  Patient identified by MRN, date of birth, ID band Patient awake    Reviewed: Allergy & Precautions, NPO status , Patient's Chart, lab work & pertinent test results  Airway Mallampati: II  TM Distance: >3 FB Neck ROM: Full    Dental  (+) Missing   Pulmonary Current Smoker,    Pulmonary exam normal breath sounds clear to auscultation       Cardiovascular + CAD  Normal cardiovascular exam Rhythm:Regular Rate:Normal  ECG: SR, LAFB, rate 60  ECHO: Normal LV systolic and diastolic function without wall motion abnormalities. No significant valve disease.  CATH: Mid LAD lesion is 25% stenosed. The left ventricular systolic function is normal. LV end diastolic pressure is normal. The left ventricular ejection fraction is 55-65% by visual estimate. 1. Minor nonobstructive CAD 2. Normal LV function 3. Normal LVEDP Plan: Check Echo. Cycle troponins. No evidence of acute lesion on cardiac cath. Start PPI. Consider noncardiac causes of chest pain. Recommend Aspirin 19m daily for moderate CAD.   Neuro/Psych negative neurological ROS  negative psych ROS   GI/Hepatic negative GI ROS, Neg liver ROS,   Endo/Other  negative endocrine ROS  Renal/GU negative Renal ROS     Musculoskeletal negative musculoskeletal ROS (+)   Abdominal   Peds  Hematology negative hematology ROS (+)   Anesthesia Other Findings Epigastric pain  Reproductive/Obstetrics                           Anesthesia Physical Anesthesia Plan  ASA: II  Anesthesia Plan: MAC   Post-op Pain Management:    Induction: Intravenous  PONV Risk Score and Plan: 1 and Propofol infusion and Treatment may vary due to age or medical condition  Airway Management Planned: Natural Airway  Additional Equipment:   Intra-op Plan:   Post-operative Plan:   Informed Consent: I have reviewed the patients History and  Physical, chart, labs and discussed the procedure including the risks, benefits and alternatives for the proposed anesthesia with the patient or authorized representative who has indicated his/her understanding and acceptance.   Dental advisory given  Plan Discussed with: CRNA  Anesthesia Plan Comments:         Anesthesia Quick Evaluation

## 2018-02-21 NOTE — Discharge Summary (Addendum)
Discharge Summary    Patient ID: [name],  MRN: [id number], DOB/AGE: 1965/12/26 52 y.o.  Admit date: 02/19/2018 Discharge date: 02/21/2018  Primary Care Provider: No primary care provider on file. Primary Cardiologist: Dr. Swaziland   Discharge Diagnoses    Principal Problem:   Acute chest pain Active Problems:   ST elevation on ECG   Aortic atherosclerosis (HCC)   Gastric ulcer  Allergies No Known Allergies  Diagnostic Studies/Procedures    Cath: 02/19/18   Mid LAD lesion is 25% stenosed.  The left ventricular systolic function is normal.  LV end diastolic pressure is normal.  The left ventricular ejection fraction is 55-65% by visual estimate.   1. Minor nonobstructive CAD 2. Normal LV function 3. Normal LVEDP  Plan: Check Echo. Cycle troponins. No evidence of acute lesion on cardiac cath. Start PPI. Consider noncardiac causes of chest pain.  Recommend Aspirin 81mg  daily for moderate CAD.  TTE: 02/19/18  Study Conclusions  - Left ventricle: The cavity size was normal. There was mild   concentric hypertrophy. Systolic function was normal. The   estimated ejection fraction was in the range of 60% to 65%. Wall   motion was normal; there were no regional wall motion   abnormalities. Left ventricular diastolic function parameters   were normal. - Aortic valve: There was no significant regurgitation. - Mitral valve: There was trivial regurgitation. - Atrial septum: No defect or patent foramen ovale was identified. - Tricuspid valve: There was trivial regurgitation. - Pulmonic valve: There was no significant regurgitation.  Impressions:  - Normal LV systolic and diastolic function without wall motion   abnormalities. No significant valve disease. _____________   History of Present Illness     [name] is a 52 yo male with no prior cardiac hx. Smokes cigars. No reported family hx of CAD. 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 Has been having intermittent episodes of chest pain for about a month. The night prior to admission around 6pm developed centralized chest pain with nausea, shortness of breath and diaphoresis. Symptoms lingered throughout the evening and he presented to the ED this morning.   In the ED his initial EKG showed ST elevation in lead I, aVL, and v2 with reciprocal changes in inferior leads. Repeat EKG showed continuation of changes. EDP asked cardiology to evaluate EKG and Code STEMI was called. He was given 324mg  ASA and 4000 units of heparin.Seen by Dr. Swaziland in the ED and brought to the cath lab for emergent cardiac cath.   Hospital Course     Consultants: GI  Underwent cardiac cath noted above with minor nonobstructive CAD with normal LVEDP and normal LV. Follow up echo showed normal EF with no WMA. He continued to complain of abd pain. Given GI cocktail and morphine with some improvement. KUB done and negative. Still with lingering pain and GI consulted. Underwent EGD with gastric and peptic ulcers. Recommendation for PPI BID for 8 weeks. Smoking cessation was discussed this admission.   [name] was seen by Dr. Allyson Sabal and determined stable for discharge home. Follow up in the office has been arranged. Medications are listed below.   _____________  Discharge Vitals Blood pressure 134/86, pulse (!) 52, temperature 97.8 F (36.6 C), temperature source Oral, resp. rate 20, height 6\' 2"  (1.88 m), weight 81.9 kg, SpO2 100 %.  Filed Weights   02/19/18 1750 02/20/18 0627 02/21/18 0500  Weight: 83.1 kg 82.4 kg 81.9 kg  Labs & Radiologic Studies    CBC Recent Labs    02/19/18 0942 02/20/18 0419  WBC 6.8 5.6  HGB 13.9 13.7  HCT 43.0 42.3  MCV 93.1 91.4  PLT 330 304   Basic Metabolic Panel Recent Labs    08/65/78 0738 02/19/18 0942 02/20/18 0419  NA 139  --  138  K 4.0  --  3.7  CL 103  --  107  CO2 24  --  25  GLUCOSE 124*  --  86  BUN 16  --  14  CREATININE 1.00  0.95 1.01  CALCIUM 9.9  --  8.8*   Liver Function Tests Recent Labs    02/19/18 0738  AST 24  ALT 12  ALKPHOS 55  BILITOT 0.7  PROT 6.8  ALBUMIN 4.0   Recent Labs    02/19/18 0738  LIPASE 36   Cardiac Enzymes Recent Labs    02/19/18 0942 02/19/18 1524 02/19/18 2035  TROPONINI <0.03 <0.03 0.03*   BNP Invalid input(s): POCBNP D-Dimer No results for input(s): DDIMER in the last 72 hours. Hemoglobin A1C No results for input(s): HGBA1C in the last 72 hours. Fasting Lipid Panel Recent Labs    02/19/18 0942  CHOL 148  HDL 70  LDLCALC 75  TRIG 15  CHOLHDL 2.1   Thyroid Function Tests No results for input(s): TSH, T4TOTAL, T3FREE, THYROIDAB in the last 72 hours.  Invalid input(s): FREET3 _____________  Cathleen Corti 1 View  Result Date: 02/19/2018 CLINICAL DATA:  Upper abdominal pain EXAM: ABDOMEN - 1 VIEW COMPARISON:  None. FINDINGS: Nonobstructed bowel-gas pattern with mild stool. No abnormal calcifications. Spurring or old fracture deformity at the left pubic symphysis. IMPRESSION: Nonobstructed gas pattern Electronically Signed   By: Jasmine Pang M.D.   On: 02/19/2018 18:40   Dg Chest Port 1 View  Result Date: 02/19/2018 CLINICAL DATA:  Upper abdominal region pain EXAM: PORTABLE CHEST 1 VIEW COMPARISON:  None. FINDINGS: There is no edema or consolidation. The heart size and pulmonary vascularity are normal. No adenopathy. There is aortic atherosclerosis. No evident bone lesions. No pneumothorax. IMPRESSION: No edema or consolidation. Heart size normal. There is aortic atherosclerosis. Aortic Atherosclerosis (ICD10-I70.0). Electronically Signed   By: Bretta Bang III M.D.   On: 02/19/2018 08:37   US Abdomen Limited Ruq  Result Date: 02/20/2018 CLINICAL DATA:  Epigastric pain for 1 month. EXAM: ULTRASOUND ABDOMEN LIMITED RIGHT UPPER QUADRANT COMPARISON:  None. FINDINGS: Gallbladder: No gallstones or wall thickening visualized. No sonographic Murphy sign noted by  sonographer. Common bile duct: Diameter: 3 mm Liver: No focal lesion identified. Within normal limits in parenchymal echogenicity. Portal vein is patent on color Doppler imaging with normal direction of blood flow towards the liver. IMPRESSION: Normal right upper quadrant ultrasound.  The gallbladder is normal. Electronically Signed   By: Sherian Rein M.D.   On: 02/20/2018 20:17   Disposition   Pt is being discharged home today in good condition.  Follow-up Plans & Appointments    Follow-up Information    Loletta Specter, PA-C.   Specialty:  Physician Assistant Contact information: [address] [place] [address] [phone]        Swaziland, Peter M, MD Follow up.   Specialty:  Cardiology Why:  The office will call you with a follow up appt.  Contact information: [address] [place] [address] [phone]            Discharge Medications  Medication List    TAKE these medications   aspirin 81 MG chewable tablet Chew 1 tablet (81 mg total) by mouth daily.   pantoprazole 40 MG tablet Commonly known as:  PROTONIX Take 1 tablet (40 mg total) by mouth 2 (two) times daily.        Acute coronary syndrome (MI, NSTEMI, STEMI, etc) this admission?: No.     Outstanding Labs/Studies   N/a   Duration of Discharge Encounter   Greater than 30 minutes including physician time.  Signed, Laverda Page NP-C 02/21/2018, 3:30 PM  Agree with note by Laverda Page NP-C  Clean cath, EGD that showed gastric and peptic ulcer disease most likely as etiology for his presenting symptoms.  Okay for discharge home this afternoon on twice daily PPI for 8 weeks.  Follow-up with an APP in 7 to 10 days and with Dr. Swaziland in 4 to 6 weeks.  Runell Gess, M.D., FACP, [facility], [facility] [address] [place], [place]  [address]  [phone] 02/21/2018 4:16 PM

## 2018-02-21 NOTE — Op Note (Signed)
EGD was performed for epigastric, upper abdominal and right upper quadrant abdominal pain with an unremarkable ultrasound. Findings:  Z line was regular at 45 cm from incisors, normal-appearing esophagus. Localize moderately erythematous mucosa without bleeding found in gastric antrum, prepyloric region of stomach and at the pylorus, biopsies taken for H. Pylori testing. Few nonbleeding superficial gastric ulcers with a clean base noted in the gastric antrum, largest 5 mm in size. The cardia and fundus appeared unremarkable on retroflexion. Localize moderately erythematous mucosa without bleeding noted in duodenal bulb and first portion of duodenum. One nonbleeding cratered duodenal ulcer with a clean base found and first portion of the duodenum, 9 mm in size. A single small submucosal nodule found and first portion of the duodenum, possible lipoma.   Recommendations: Follow pathology reports as an outpatient, treat H. Pylori if found. Avoid NSAIDs( patient was taking 3-4 pills of ibuprofen 3-4 times a day for the last several weeks) PPI twice a day for 8 weeks. Regular diet. Okay to DC from GI standpoint. Recommend follow-up in the office in 4-6 weeks. Will also need a screening colonoscopy as an outpatient.   Kerin Salen, M.D.

## 2018-02-21 NOTE — Progress Notes (Signed)
Progress Note  Patient Name: [name] Date of Encounter: 02/21/2018  Primary Cardiologist: Dr. Peter Swaziland  Subjective   Mr. [name] is postop day 1 urgent cardiac catheterization for "STEMI" with normal coronary arteries a cath and normal LV function.  Enzymes were negative.  His pain was more epigastric and I suspect his pain is gastrointestinal.  He underwent EGD today revealing peptic ulcer disease and gastric ulcers.  This probably in retrospect represented the etiology of his symptoms.  Inpatient Medications    Scheduled Meds: . aspirin  81 mg Oral Daily  . atorvastatin  80 mg Oral q1800  . enoxaparin (LOVENOX) injection  30 mg Subcutaneous Q24H  . pantoprazole  40 mg Oral Daily  . sodium chloride flush  3 mL Intravenous Q12H   Continuous Infusions: . sodium chloride     PRN Meds: sodium chloride, acetaminophen, nitroGLYCERIN, ondansetron (ZOFRAN) IV, oxyCODONE, sodium chloride flush   Vital Signs    Vitals:   02/21/18 1128 02/21/18 1130 02/21/18 1140 02/21/18 1220  BP: 115/68 109/60 (!) 109/57 134/86  Pulse: (!) 58 64 (!) 56 (!) 52  Resp: 20 (!) 22 20 20   Temp: 98 F (36.7 C)   97.8 F (36.6 C)  TempSrc: Oral   Oral  SpO2: 100%  100% 100%  Weight:      Height:        Intake/Output Summary (Last 24 hours) at 02/21/2018 1511 Last data filed at 02/21/2018 1300 Gross per 24 hour  Intake 223.34 ml  Output 1420 ml  Net -1196.66 ml   Filed Weights   02/19/18 1750 02/20/18 0627 02/21/18 0500  Weight: 83.1 kg 82.4 kg 81.9 kg    Telemetry    Normal sinus rhythm- Personally Reviewed  ECG    Not performed today personally Reviewed  Physical Exam   GEN: No acute distress.   Neck: No JVD Cardiac: RRR, no murmurs, rubs, or gallops.  Respiratory: Clear to auscultation bilaterally. GI: Soft, nontender, non-distended  MS: No edema; No deformity. Neuro:  Nonfocal  Psych: Normal affect   Labs    Chemistry Recent Labs  Lab 02/19/18 0738  02/19/18 0942 02/20/18 0419  NA 139  --  138  K 4.0  --  3.7  CL 103  --  107  CO2 24  --  25  GLUCOSE 124*  --  86  BUN 16  --  14  CREATININE 1.00 0.95 1.01  CALCIUM 9.9  --  8.8*  PROT 6.8  --   --   ALBUMIN 4.0  --   --   AST 24  --   --   ALT 12  --   --   ALKPHOS 55  --   --   BILITOT 0.7  --   --   GFRNONAA >60 >60 >60  GFRAA >60 >60 >60  ANIONGAP 12  --  6     Hematology Recent Labs  Lab 02/19/18 0738 02/19/18 0942 02/20/18 0419  WBC 6.8 6.8 5.6  RBC 4.70 4.62 4.63  HGB 14.3 13.9 13.7  HCT 43.9 43.0 42.3  MCV 93.4 93.1 91.4  MCH 30.4 30.1 29.6  MCHC 32.6 32.3 32.4  RDW 14.4 14.6 14.4  PLT 338 330 304    Cardiac Enzymes Recent Labs  Lab 02/19/18 0942 02/19/18 1524 02/19/18 2035  TROPONINI <0.03 <0.03 0.03*   No results for input(s): TROPIPOC in the last 168 hours.   BNPNo results for input(s): BNP, PROBNP in the  last 168 hours.   DDimer No results for input(s): DDIMER in the last 168 hours.   Radiology    Dg Abd 1 View  Result Date: 02/19/2018 CLINICAL DATA:  Upper abdominal pain EXAM: ABDOMEN - 1 VIEW COMPARISON:  None. FINDINGS: Nonobstructed bowel-gas pattern with mild stool. No abnormal calcifications. Spurring or old fracture deformity at the left pubic symphysis. IMPRESSION: Nonobstructed gas pattern Electronically Signed   By: Jasmine Pang M.D.   On: 02/19/2018 18:40   US Abdomen Limited Ruq  Result Date: 02/20/2018 CLINICAL DATA:  Epigastric pain for 1 month. EXAM: ULTRASOUND ABDOMEN LIMITED RIGHT UPPER QUADRANT COMPARISON:  None. FINDINGS: Gallbladder: No gallstones or wall thickening visualized. No sonographic Murphy sign noted by sonographer. Common bile duct: Diameter: 3 mm Liver: No focal lesion identified. Within normal limits in parenchymal echogenicity. Portal vein is patent on color Doppler imaging with normal direction of blood flow towards the liver. IMPRESSION: Normal right upper quadrant ultrasound.  The gallbladder is normal.  Electronically Signed   By: Sherian Rein M.D.   On: 02/20/2018 20:17    Cardiac Studies   2D echocardiogram (02/19/2018)  Study Conclusions  - Left ventricle: The cavity size was normal. There was mild   concentric hypertrophy. Systolic function was normal. The   estimated ejection fraction was in the range of 60% to 65%. Wall   motion was normal; there were no regional wall motion   abnormalities. Left ventricular diastolic function parameters   were normal. - Aortic valve: There was no significant regurgitation. - Mitral valve: There was trivial regurgitation. - Atrial septum: No defect or patent foramen ovale was identified. - Tricuspid valve: There was trivial regurgitation. - Pulmonic valve: There was no significant regurgitation.  Impressions:  - Normal LV systolic and diastolic function without wall motion   abnormalities. No significant valve disease  Cardiac catheterization (02/19/2018)   Mid LAD lesion is 25% stenosed.  The left ventricular systolic function is normal.  LV end diastolic pressure is normal.  The left ventricular ejection fraction is 55-65% by visual estimate.   1. Minor nonobstructive CAD 2. Normal LV function 3. Normal LVEDP  Plan: Check Echo. Cycle troponins. No evidence of acute lesion on cardiac cath. Start PPI. Consider noncardiac causes of chest pain.  Recommend Aspirin 81mg  daily for moderate CAD.  Patient Profile     52 y.o. male admitted with epigastric pain and abnormal EKG.  He is brought to the Cath Lab urgently and underwent cardiac catheterization radially by Dr. Swaziland revealing normal coronaries and normal LV function.  His troponins were negative.  He did complain of epigastric pain.  EGD showed gastric and peptic ulcers.  Assessment & Plan    1: Epigastric pain- patient had epigastric pain rating to his chest with an abnormal EKG that led to heart cath by Dr. Swaziland radially that was negative.  Underwent EGD today  revealing gastric and peptic ulcers.  Recommendation was for twice daily PPI for 8 weeks.  He stable for discharge.  2: Tobacco abuse- encouraged smoking cessation.  For questions or updates, please contact [facility] Please consult [email or web address] for contact info under Cardiology/STEMI.   Mr. [name] is stable for discharge today.  He will be sent home on twice daily PPI for gastric and peptic ulcers.  He will follow-up with mid-level provider in 2 to 3 weeks and Dr. Swaziland in 4 to 6 weeks.  Signed, Nanetta Batty, MD  02/21/2018, 3:11 PM

## 2018-02-21 NOTE — Op Note (Signed)
[facility] Patient Name: [name] Procedure Date : 02/21/2018 MRN: [id number] Attending MD: Kerin Salen , MD Date of Birth: [birth year] CSN: [id number] Age: 52 Admit Type: Inpatient Procedure:                Upper GI endoscopy Indications:              Epigastric abdominal pain, Abdominal pain in the                            right upper quadrant, Upper abdominal pain Providers:                Kerin Salen, MD, Norman Clay, RN, Beryle Beams,                            Technician Referring MD:              Medicines:                Monitored Anesthesia Care Complications:            No immediate complications. Estimated blood loss:                            None. Estimated Blood Loss:     Estimated blood loss was minimal. Procedure:                Pre-Anesthesia Assessment:                           - Prior to the procedure, a History and Physical                            was performed, and patient medications and                            allergies were reviewed. The patient's tolerance of                            previous anesthesia was also reviewed. The risks                            and benefits of the procedure and the sedation                            options and risks were discussed with the patient.                            All questions were answered, and informed consent                            was obtained. Prior Anticoagulants: The patient has                            taken no previous anticoagulant or antiplatelet  agents. ASA Grade Assessment: II - A patient with                            mild systemic disease. After reviewing the risks                            and benefits, the patient was deemed in                            satisfactory condition to undergo the procedure.                           After obtaining informed consent, the endoscope was                            passed under direct vision.  Throughout the                            procedure, the patient's blood pressure, pulse, and                            oxygen saturations were monitored continuously. The                            [id number]-H190 ([id number] Olympus Adult EGD was introduced                            through the mouth, and advanced to the second part                            of duodenum. The upper GI endoscopy was                            accomplished without difficulty. The patient                            tolerated the procedure well. Scope In: Scope Out: Findings:      The examined esophagus was normal.      The Z-line was regular and was found 45 cm from the incisors.      Localized moderately erythematous mucosa without bleeding was found in       the gastric antrum, in the prepyloric region of the stomach and at the       pylorus. Biopsies were taken with a cold forceps for Helicobacter pylori       testing.      Few non-bleeding superficial gastric ulcers with a clean ulcer base       (Forrest Class III) were found in the gastric antrum. The largest lesion       was 5 mm in largest dimension.      The cardia and gastric fundus were normal on retroflexion.      Localized moderately erythematous mucosa without active bleeding and       with no stigmata of bleeding was found in the duodenal bulb and in the       first portion  of the duodenum.      One non-bleeding cratered duodenal ulcer with a clean ulcer base       (Forrest Class III) was found in the first portion of the duodenum. The       lesion was 9 mm in largest dimension.      A single small submucosal nodule with a localized distribution was found       in the first portion of the duodenum. Impression:               - Normal esophagus.                           - Z-line regular, 45 cm from the incisors.                           - Erythematous mucosa in the antrum, prepyloric                            region of the stomach and pylorus.  Biopsied.                           - Non-bleeding gastric ulcers with a clean ulcer                            base (Forrest Class III).                           - Erythematous duodenopathy.                           - One non-bleeding duodenal ulcer with a clean                            ulcer base (Forrest Class III).                           - Small submucosal nodule found in the duodenum. Moderate Sedation:      Patient did not receive moderate sedation for this procedure, but       instead received monitored anesthesia care. Recommendation:           - Resume regular diet.                           - Use Protonix (pantoprazole) 40 mg PO BID for 8                            weeks.                           - Await pathology results.                           - Treat H pylori if found.                           - Avoid NSAIDs. Procedure Code(s):        ---  Professional ---                           [phone], Esophagogastroduodenoscopy, flexible,                            transoral; with biopsy, single or multiple Diagnosis Code(s):        --- Professional ---                           K31.89, Other diseases of stomach and duodenum                           K25.9, Gastric ulcer, unspecified as acute or                            chronic, without hemorrhage or perforation                           K26.9, Duodenal ulcer, unspecified as acute or                            chronic, without hemorrhage or perforation                           R10.13, Epigastric pain                           R10.11, Right upper quadrant pain                           R10.10, Upper abdominal pain, unspecified CPT copyright 2017 American Medical Association. All rights reserved. The codes documented in this report are preliminary and upon coder review may  be revised to meet current compliance requirements. Kerin Salen, MD 02/21/2018 11:24:44 AM This report has been signed electronically. Number of  Addenda: 0

## 2018-02-21 NOTE — Interval H&P Note (Signed)
History and Physical Interval Note: 52/male with non cardiac chest and epigastric pain for a diagnostic EGD.  02/21/2018 10:43 AM  [name]  has presented today for EGD, with the diagnosis of epigastric pain  The various methods of treatment have been discussed with the patient and family. After consideration of risks, benefits and other options for treatment, the patient has consented to  Procedure(s): ESOPHAGOGASTRODUODENOSCOPY (EGD) WITH PROPOFOL (N/A) as a surgical intervention .  The patient's history has been reviewed, patient examined, no change in status, stable for surgery.  I have reviewed the patient's chart and labs.  Questions were answered to the patient's satisfaction.     Kerin Salen

## 2018-02-21 NOTE — Transfer of Care (Signed)
Immediate Anesthesia Transfer of Care Note  Patient: [name]  Procedure(s) Performed: ESOPHAGOGASTRODUODENOSCOPY (EGD) WITH PROPOFOL (N/A )  Patient Location: PACU and Endoscopy Unit  Anesthesia Type:MAC  Level of Consciousness: awake, alert  and oriented  Airway & Oxygen Therapy: Patient Spontanous Breathing  Post-op Assessment: Report given to RN and Post -op Vital signs reviewed and stable  Post vital signs: Reviewed and stable  Last Vitals:  Vitals Value Taken Time  BP 115/68 02/21/2018 11:28 AM  Temp    Pulse 58 02/21/2018 11:28 AM  Resp 22 02/21/2018 11:33 AM  SpO2    Vitals shown include unvalidated device data.  Last Pain:  Vitals:   02/21/18 1128  TempSrc: Oral  PainSc: 0-No pain         Complications: No apparent anesthesia complications

## 2018-02-21 NOTE — Anesthesia Procedure Notes (Signed)
Procedure Name: MAC Date/Time: 02/21/2018 11:06 AM Performed by: Eligha Bridegroom, CRNA Pre-anesthesia Checklist: Patient identified, Emergency Drugs available, Suction available and Timeout performed Patient Re-evaluated:Patient Re-evaluated prior to induction Oxygen Delivery Method: Nasal cannula Preoxygenation: Pre-oxygenation with 100% oxygen Induction Type: IV induction

## 2018-02-21 NOTE — Progress Notes (Signed)
Patient discharged from unit to home. All discharge instructions reviewed with patient. All personal belongings with patient.   Patient makes no c/o discomfort at this time.

## 2018-02-24 ENCOUNTER — Encounter (HOSPITAL_COMMUNITY): Payer: Self-pay | Admitting: Gastroenterology

## 2018-02-24 MED FILL — Verapamil HCl IV Soln 2.5 MG/ML: INTRAVENOUS | Qty: 2 | Status: AC

## 2018-02-25 NOTE — Anesthesia Postprocedure Evaluation (Signed)
Anesthesia Post Note  Patient: [name]  Procedure(s) Performed: ESOPHAGOGASTRODUODENOSCOPY (EGD) WITH PROPOFOL (N/A ) BIOPSY     Patient location during evaluation: PACU Anesthesia Type: MAC Level of consciousness: awake and alert Pain management: pain level controlled Vital Signs Assessment: post-procedure vital signs reviewed and stable Respiratory status: spontaneous breathing, nonlabored ventilation, respiratory function stable and patient connected to nasal cannula oxygen Cardiovascular status: stable and blood pressure returned to baseline Postop Assessment: no apparent nausea or vomiting Anesthetic complications: no    Last Vitals:  Vitals:   02/21/18 1140 02/21/18 1220  BP: (!) 109/57 134/86  Pulse: (!) 56 (!) 52  Resp: 20 20  Temp:  36.6 C  SpO2: 100% 100%    Last Pain:  Vitals:   02/21/18 1220  TempSrc: Oral  PainSc:                  Ryan P Ellender
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
## 2018-03-10 ENCOUNTER — Ambulatory Visit: Payer: Self-pay | Admitting: Physician Assistant

## 2018-03-11 ENCOUNTER — Encounter: Payer: Self-pay | Admitting: *Deleted

## 2018-03-20 ENCOUNTER — Inpatient Hospital Stay: Payer: Self-pay

## 2018-03-20 NOTE — Progress Notes (Deleted)
Patient ID: WASH NIENHAUS, male   DOB: Feb 26, 1966, 52 y.o.   MRN: [id number]  After being hospitalized 9/04-9/11/2017 for chest pain and ekg changes.   From Discharge summary: HPI: Mr. Uy is a 52 yo male with no prior cardiac hx. Smokes cigars. No reported family hx of CAD. 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 Has been having intermittent episodes of chest pain for about a month. The night prior to admission around 6pm developed centralized chest pain with nausea, shortness of breath and diaphoresis. Symptoms lingered throughout the evening and he presented to the ED this morning.   In the ED his initial EKG showed ST elevation in lead I, aVL, and v2 with reciprocal changes in inferior leads. Repeat EKG showed continuation of changes. EDP asked cardiology to evaluate EKG and Code STEMI was called. He was given 324mg  ASA and 4000 units of heparin.Seen by Dr. Swaziland in the ED and brought to the cath lab for emergent cardiac cath.  Hospital course: Underwent cardiac cath noted above with minor nonobstructive CAD with normal LVEDP and normal LV. Follow up echo showed normal EF with no WMA. He continued to complain of abd pain. Given GI cocktail and morphine with some improvement. KUB done and negative. Still with lingering pain and GI consulted. Underwent EGD with gastric and peptic ulcers. Recommendation for PPI BID for 8 weeks. Smoking cessation was discussed this admission.   Procedures: Cath: 02/19/18   Mid LAD lesion is 25% stenosed.  The left ventricular systolic function is normal.  LV end diastolic pressure is normal.  The left ventricular ejection fraction is 55-65% by visual estimate.  1. Minor nonobstructive CAD 2. Normal LV function 3. Normal LVEDP  Plan: Check Echo. Cycle troponins. No evidence of acute lesion on cardiac cath. Start PPI. Consider noncardiac causes of chest pain.  Recommend Aspirin 81mg  daily for moderate CAD.  TTE:  02/19/18  Study Conclusions  - Left ventricle: The cavity size was normal. There was mild concentric hypertrophy. Systolic function was normal. The estimated ejection fraction was in the range of 60% to 65%. Wall motion was normal; there were no regional wall motion abnormalities. Left ventricular diastolic function parameters were normal. - Aortic valve: There was no significant regurgitation. - Mitral valve: There was trivial regurgitation. - Atrial septum: No defect or patent foramen ovale was identified. - Tricuspid valve: There was trivial regurgitation. - Pulmonic valve: There was no significant regurgitation.  Impressions:  - Normal LV systolic and diastolic function without wall motion abnormalities. No significant valve disease.

## 2020-02-14 ENCOUNTER — Other Ambulatory Visit: Payer: Self-pay

## 2020-02-14 ENCOUNTER — Emergency Department (HOSPITAL_COMMUNITY): Payer: Self-pay

## 2020-02-14 ENCOUNTER — Emergency Department (HOSPITAL_COMMUNITY)
Admission: EM | Admit: 2020-02-14 | Discharge: 2020-02-14 | Disposition: A | Discharge status: Home / Self Care | Payer: Self-pay | Attending: Emergency Medicine | Admitting: Emergency Medicine

## 2020-02-14 DIAGNOSIS — Z7982 Long term (current) use of aspirin: Secondary | ICD-10-CM | POA: Insufficient documentation

## 2020-02-14 DIAGNOSIS — Z79899 Other long term (current) drug therapy: Secondary | ICD-10-CM | POA: Insufficient documentation

## 2020-02-14 DIAGNOSIS — R0789 Other chest pain: Secondary | ICD-10-CM | POA: Insufficient documentation

## 2020-02-14 DIAGNOSIS — Z20822 Contact with and (suspected) exposure to covid-19: Secondary | ICD-10-CM | POA: Insufficient documentation

## 2020-02-14 DIAGNOSIS — F17290 Nicotine dependence, other tobacco product, uncomplicated: Secondary | ICD-10-CM | POA: Insufficient documentation

## 2020-02-14 DIAGNOSIS — Z720 Tobacco use: Secondary | ICD-10-CM

## 2020-02-14 DIAGNOSIS — R0602 Shortness of breath: Secondary | ICD-10-CM | POA: Insufficient documentation

## 2020-02-14 LAB — COMPREHENSIVE METABOLIC PANEL
ALT: 21 U/L (ref 0–44)
AST: 38 U/L (ref 15–41)
Albumin: 4.1 g/dL (ref 3.5–5.0)
Alkaline Phosphatase: 62 U/L (ref 38–126)
Anion gap: 10 (ref 5–15)
BUN: 15 mg/dL (ref 6–20)
CO2: 24 mmol/L (ref 22–32)
Calcium: 9.3 mg/dL (ref 8.9–10.3)
Chloride: 103 mmol/L (ref 98–111)
Creatinine, Ser: 1.17 mg/dL (ref 0.61–1.24)
GFR calc Af Amer: >60 mL/min (ref >60)
GFR calc non Af Amer: >60 mL/min (ref >60)
Glucose, Bld: 112 mg/dL — ABNORMAL HIGH (ref 70–99)
Potassium: 4.4 mmol/L (ref 3.5–5.1)
Sodium: 137 mmol/L (ref 135–145)
Total Bilirubin: 1 mg/dL (ref 0.3–1.2)
Total Protein: 6.8 g/dL (ref 6.5–8.1)

## 2020-02-14 LAB — CBC WITH DIFFERENTIAL/PLATELET
Abs Immature Granulocytes: 0.01 10*3/uL (ref 0.00–0.07)
Basophils Absolute: 0.1 10*3/uL (ref 0.0–0.1)
Basophils Relative: 1 %
Eosinophils Absolute: 0.1 10*3/uL (ref 0.0–0.5)
Eosinophils Relative: 2 %
HCT: 41.9 % (ref 39.0–52.0)
Hemoglobin: 13.5 g/dL (ref 13.0–17.0)
Immature Granulocytes: 0 %
Lymphocytes Relative: 46 %
Lymphs Abs: 2.2 10*3/uL (ref 0.7–4.0)
MCH: 29.5 pg (ref 26.0–34.0)
MCHC: 32.2 g/dL (ref 30.0–36.0)
MCV: 91.5 fL (ref 80.0–100.0)
Monocytes Absolute: 0.5 10*3/uL (ref 0.1–1.0)
Monocytes Relative: 11 %
Neutro Abs: 1.9 10*3/uL (ref 1.7–7.7)
Neutrophils Relative %: 40 %
Platelets: 286 10*3/uL (ref 150–400)
RBC: 4.58 MIL/uL (ref 4.22–5.81)
RDW: 14.1 % (ref 11.5–15.5)
WBC: 4.8 10*3/uL (ref 4.0–10.5)
nRBC: 0 % (ref 0.0–0.2)

## 2020-02-14 LAB — PROTIME-INR
INR: 0.9 (ref 0.8–1.2)
Prothrombin Time: 12 seconds (ref 11.4–15.2)

## 2020-02-14 LAB — TROPONIN I (HIGH SENSITIVITY)
Troponin I (High Sensitivity): 13 ng/L (ref <18)
Troponin I (High Sensitivity): 3 ng/L (ref <18)

## 2020-02-14 LAB — D-DIMER, QUANTITATIVE: D-Dimer, Quant: <0.27 ug/mL-FEU (ref 0.00–0.50)

## 2020-02-14 LAB — SARS CORONAVIRUS 2 BY RT PCR (HOSPITAL ORDER, PERFORMED IN ~~LOC~~ HOSPITAL LAB): SARS Coronavirus 2: NEGATIVE

## 2020-02-14 MED ORDER — OMEPRAZOLE 20 MG PO CPDR: 20.0000 mg | DELAYED_RELEASE_CAPSULE | Freq: Every day | ORAL | 0 refills | Status: AC

## 2020-02-14 MED ORDER — NITROGLYCERIN 0.4 MG SL SUBL: 0.4000 mg | SUBLINGUAL_TABLET | SUBLINGUAL | Status: DC | PRN

## 2020-02-14 MED ORDER — ALUM & MAG HYDROXIDE-SIMETH 200-200-20 MG/5ML PO SUSP
15.0000 mL | Freq: Once | ORAL | Status: AC
  Administered 2020-02-14: 15 mL via ORAL
  Filled 2020-02-14: qty 30

## 2020-02-14 NOTE — ED Triage Notes (Signed)
Pt arrives via EMS with c/o of chest pain / possible stemi called in by EMS. Pt reports he woke up around 0300 and felt SOB. Pt ambulated around his room and chest pain decreased. Before calling EMS around 1115 pt states he didn't have CP but rather an abnormal discomfort in the center of chest.   Pt denies n/v Pt endorses continued tightness of chest during triage Pt actively smokes  150/96 HR 80 96% RA

## 2020-02-14 NOTE — Progress Notes (Signed)
   02/14/20 1100  Clinical Encounter Type  Visited With Health care provider  Visit Type ED (STEMI)  Referral From Nurse  Consult/Referral To Chaplain   Chaplains responded to code STEMI. Pt alert and oriented. No family present. Chaplain remains available for support as needs arise.   Chaplain Resident, Amado Coe, M Div [phone] on-call pager
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
## 2020-02-14 NOTE — Discharge Instructions (Addendum)
You were evaluated in the Emergency Department and after careful evaluation, we did not find any emergent condition requiring admission or further testing in the hospital.  Your exam/testing today was overall reassuring.  Please return to the Emergency Department if you experience any worsening of your condition.  Thank you for allowing us to be a part of your care.  

## 2020-02-26 ENCOUNTER — Encounter: Payer: Self-pay | Admitting: Internal Medicine

## 2020-02-26 ENCOUNTER — Other Ambulatory Visit: Payer: Self-pay

## 2020-02-26 ENCOUNTER — Ambulatory Visit (INDEPENDENT_AMBULATORY_CARE_PROVIDER_SITE_OTHER): Payer: Self-pay | Admitting: Internal Medicine

## 2020-02-26 VITALS — BP 138/88 | HR 67 | Ht 74.0 in | Wt 204.0 lb

## 2020-02-26 DIAGNOSIS — F17200 Nicotine dependence, unspecified, uncomplicated: Secondary | ICD-10-CM

## 2020-02-26 DIAGNOSIS — I25118 Atherosclerotic heart disease of native coronary artery with other forms of angina pectoris: Secondary | ICD-10-CM

## 2020-02-26 LAB — LIPID PANEL
Chol/HDL Ratio: 2.7 ratio (ref 0.0–5.0)
Cholesterol, Total: 159 mg/dL (ref 100–199)
HDL: 59 mg/dL (ref >39)
LDL Chol Calc (NIH): 89 mg/dL (ref 0–99)
Triglycerides: 55 mg/dL (ref 0–149)
VLDL Cholesterol Cal: 11 mg/dL (ref 5–40)

## 2020-02-26 NOTE — Patient Instructions (Addendum)
Medication Instructions:  Your physician recommends that you continue on your current medications as directed. Please refer to the Current Medication list given to you today.  *If you need a refill on your cardiac medications before your next appointment, please call your pharmacy*   Lab Work: TODAY: LIPIDS  If you have labs (blood work) drawn today and your tests are completely normal, you will receive your results only by: Marland Kitchen MyChart Message (if you have MyChart) OR . A paper copy in the mail If you have any lab test that is abnormal or we need to change your treatment, we will call you to review the results.   Testing/Procedures: None   Follow-Up: Follow up with Dr. Riley Lam on 06/27/2020 at 9:20 AM   Other Instructions None
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
## 2020-06-20 IMAGING — CR DG ABDOMEN 1V
2 series · 2 of 2 positions shown · non-contrast
Comparison: None.

CLINICAL DATA: Upper abdominal pain

EXAM:
ABDOMEN - 1 VIEW

[abdomen kub (1 of 2)]
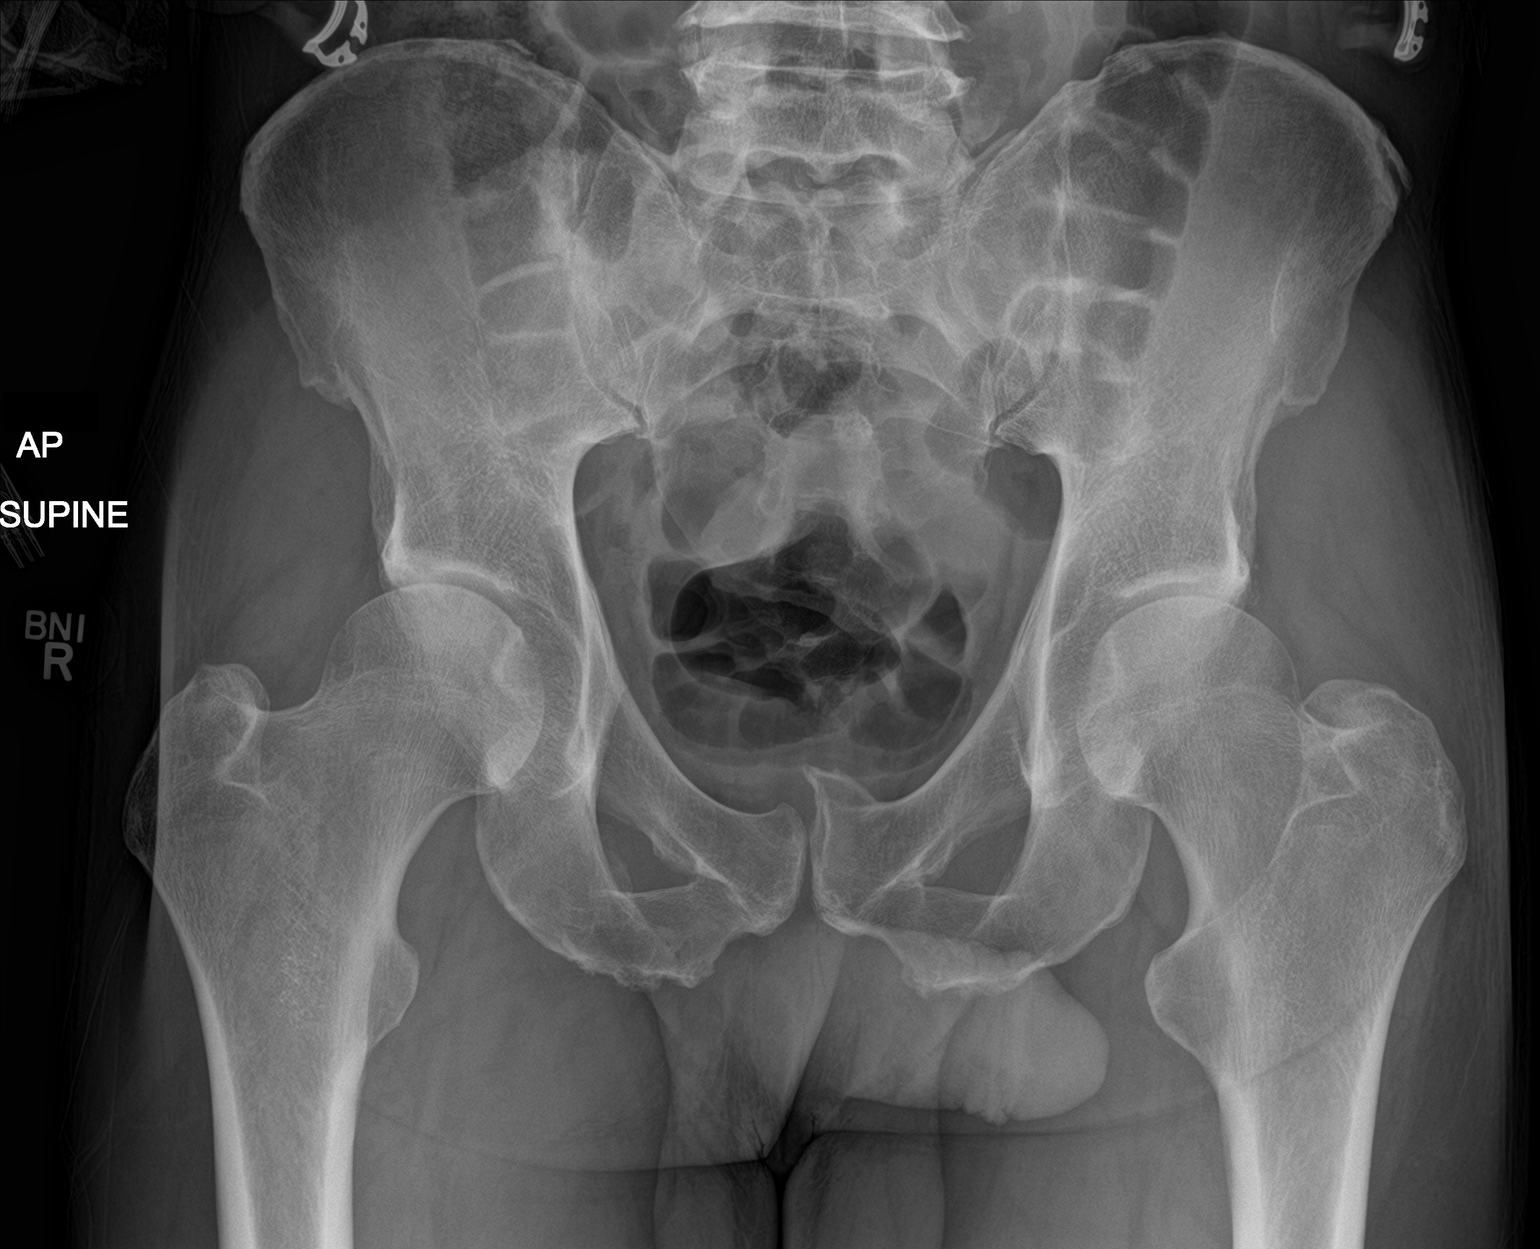

[abdomen kub (2 of 2)]
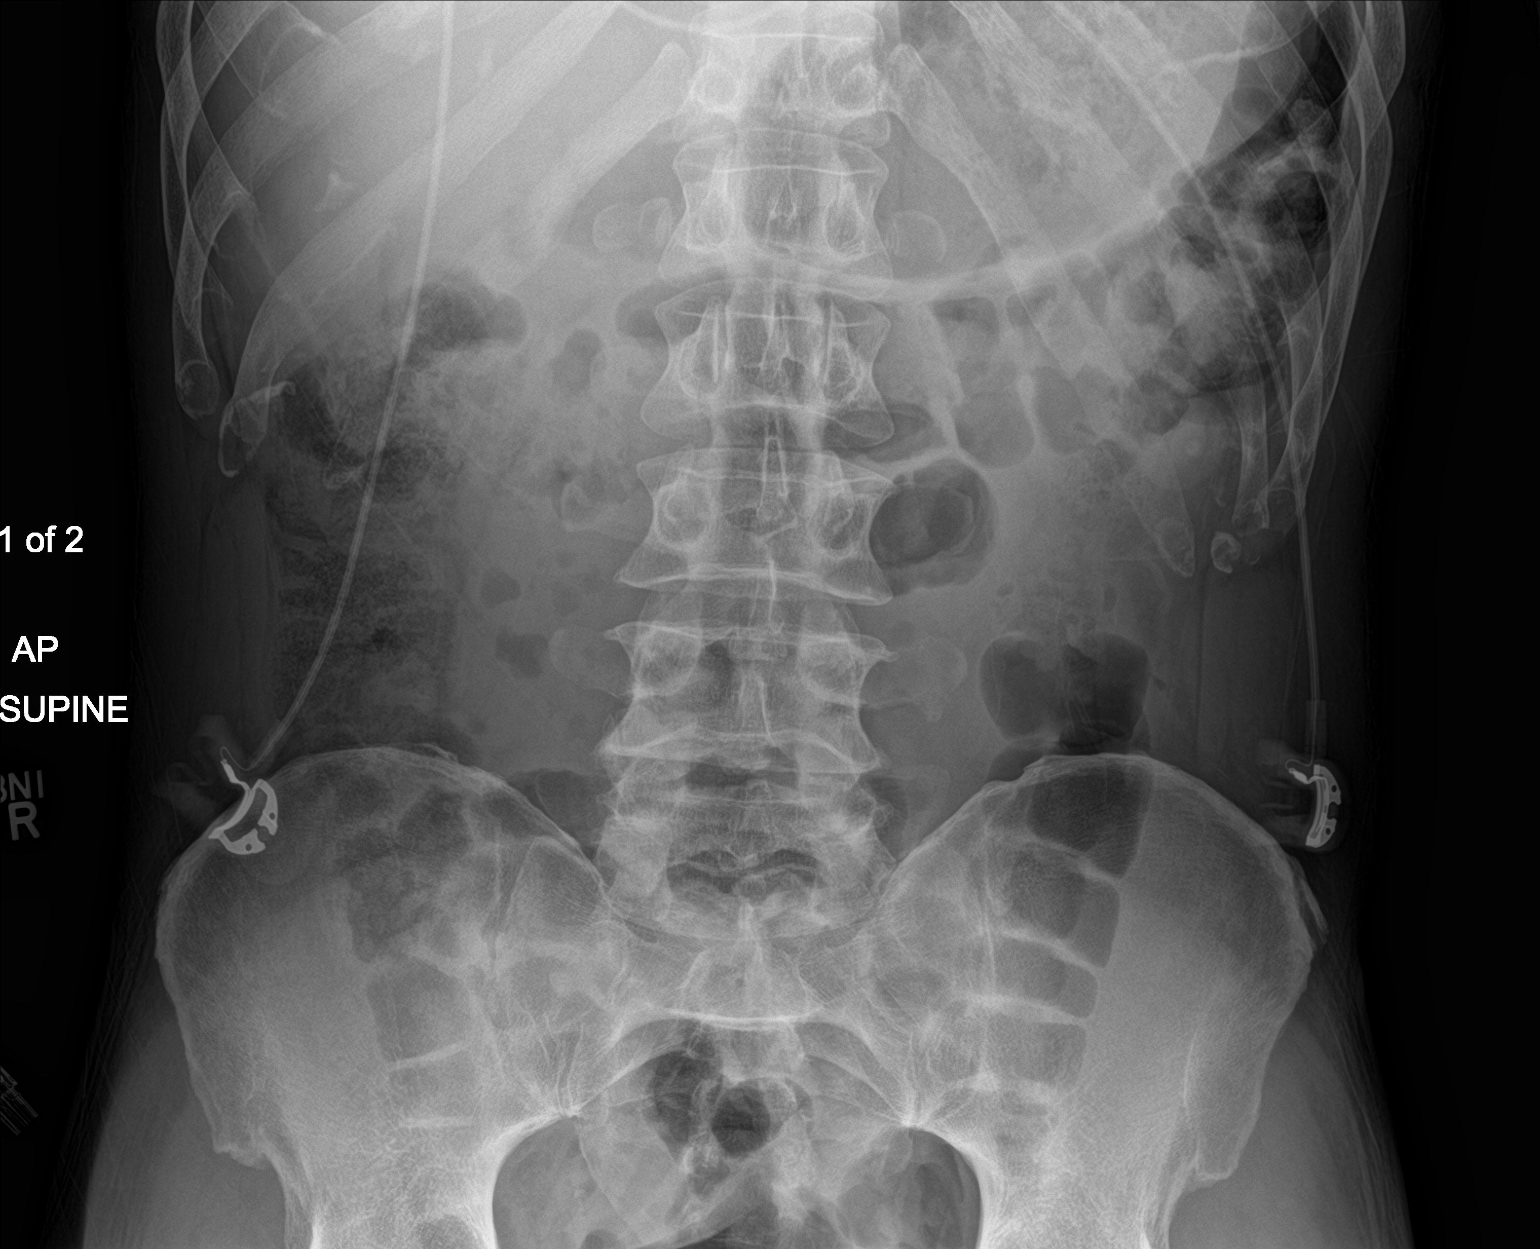

[2 of 2 positions shown; findings below may reference images not displayed]

FINDINGS: Nonobstructed bowel-gas pattern with mild stool. No abnormal
calcifications. Spurring or old fracture deformity at the left pubic
symphysis.
IMPRESSION: Nonobstructed gas pattern

## 2020-06-27 ENCOUNTER — Ambulatory Visit: Payer: Self-pay | Admitting: Internal Medicine
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
## 2020-07-24 IMAGING — US US ABDOMEN LIMITED
1 series · 14 of 25 positions shown · non-contrast
Comparison: None.

CLINICAL DATA: Epigastric pain for 1 month.

EXAM:
ULTRASOUND ABDOMEN LIMITED RIGHT UPPER QUADRANT

[Series 1: us abdomen limited · 0.22mm/px · 14 of 55 slices shown]
[im 1/55]
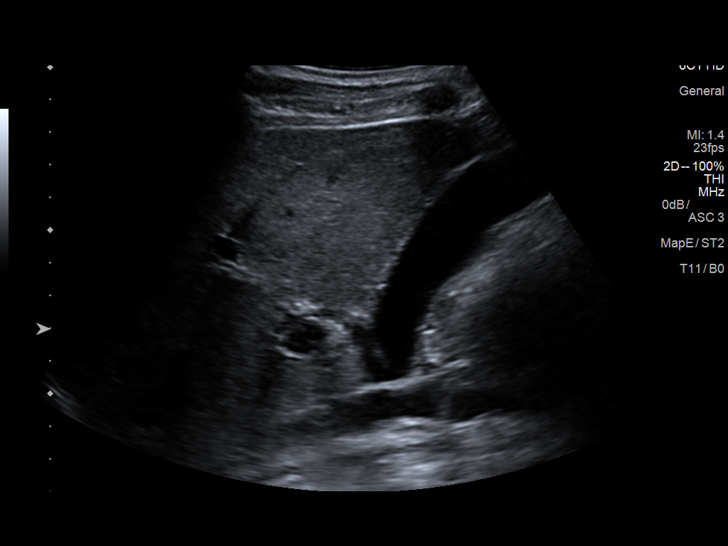
[im 5/55]
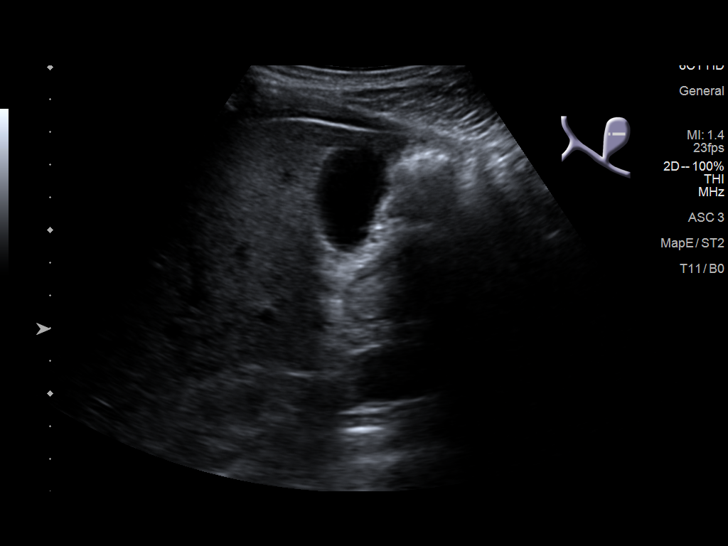
[im 10/55]
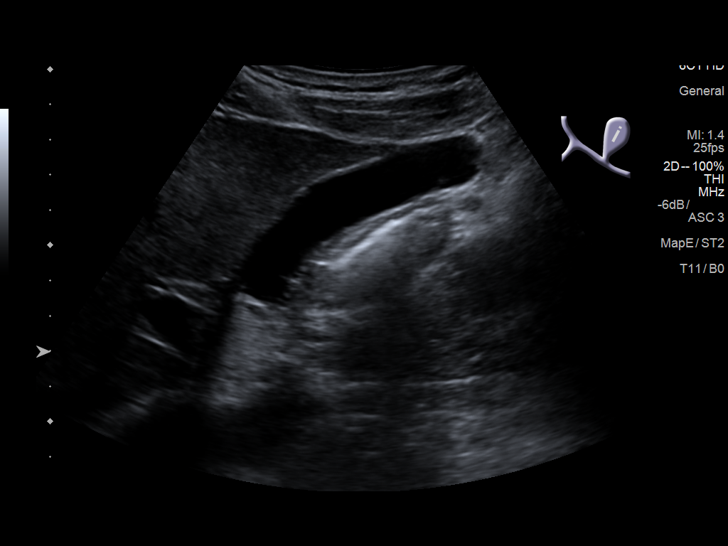
[im 14/55]
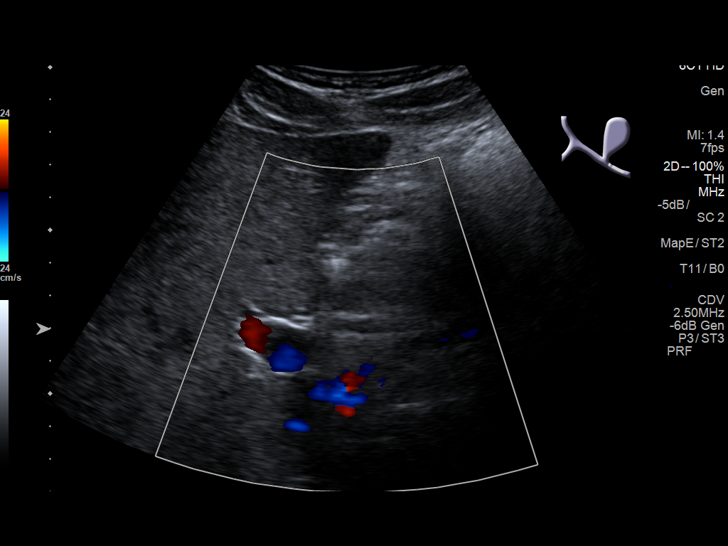
[im 19/55]
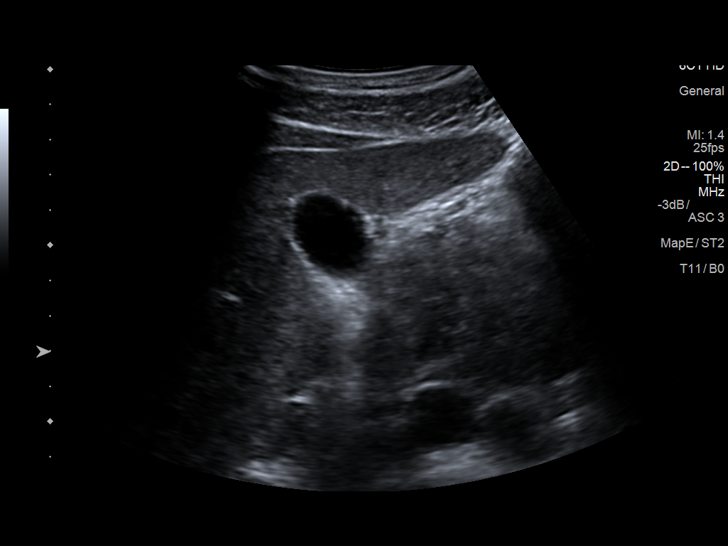
[im 21/55]
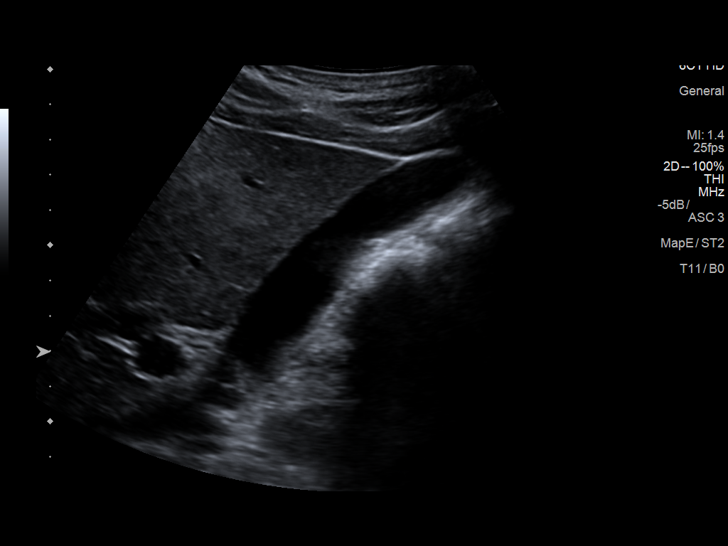
[im 25/55]
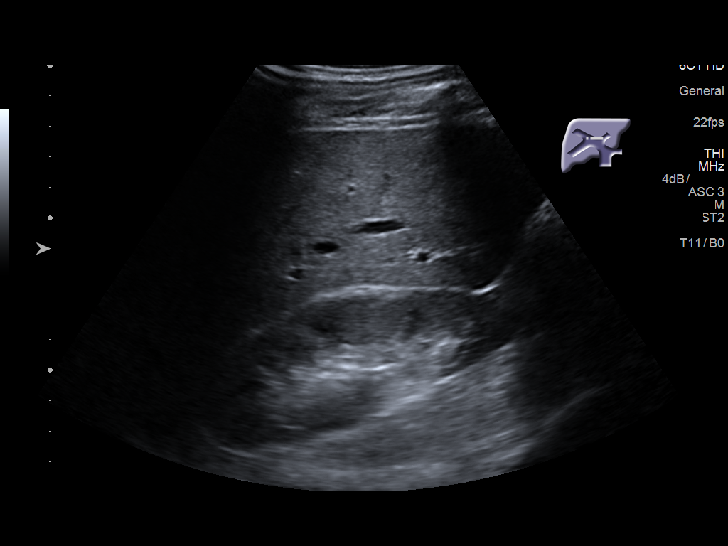
[im 30/55]
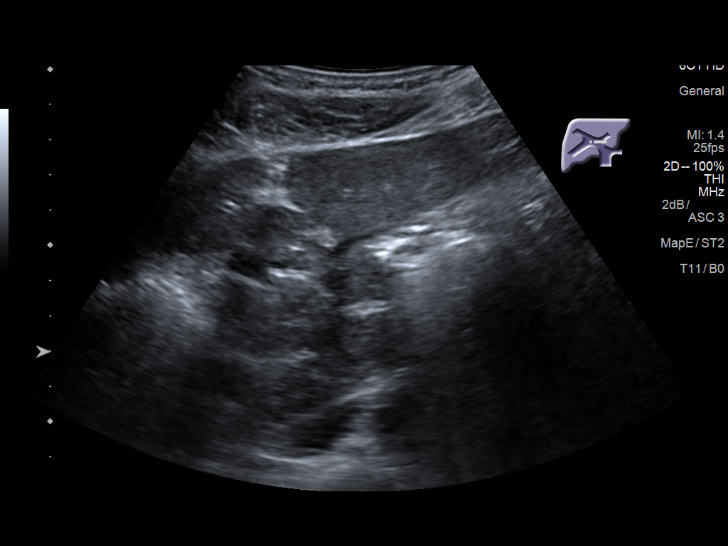
[im 34/55]
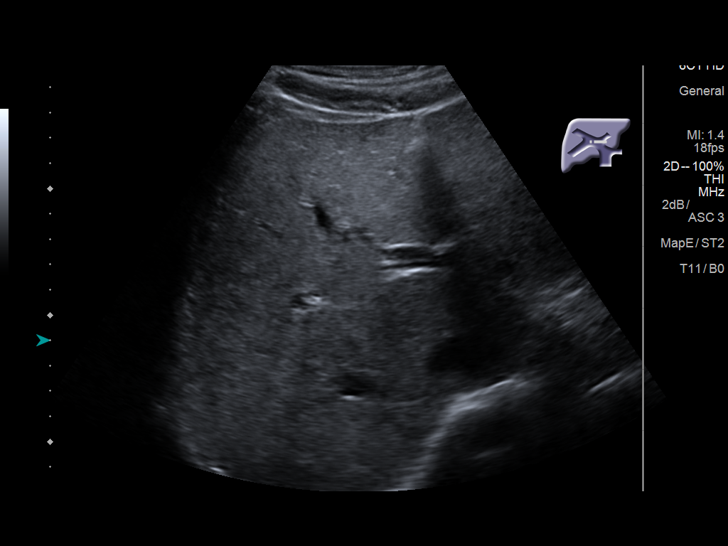
[im 37/55]
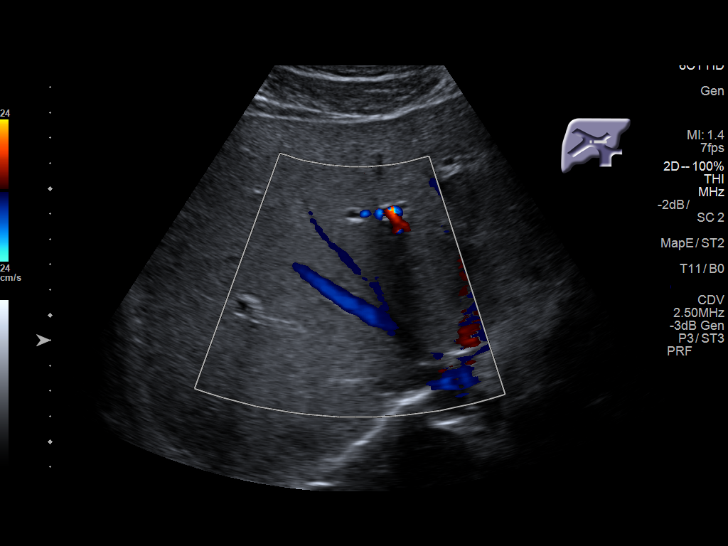
[im 41/55]
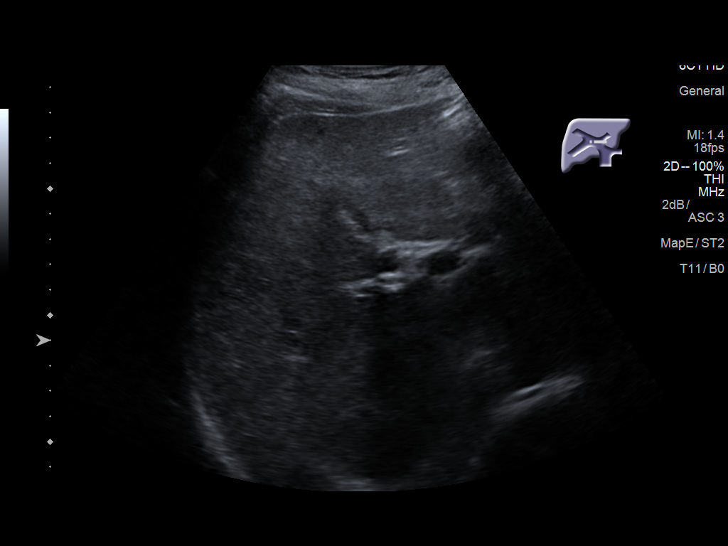
[im 46/55]
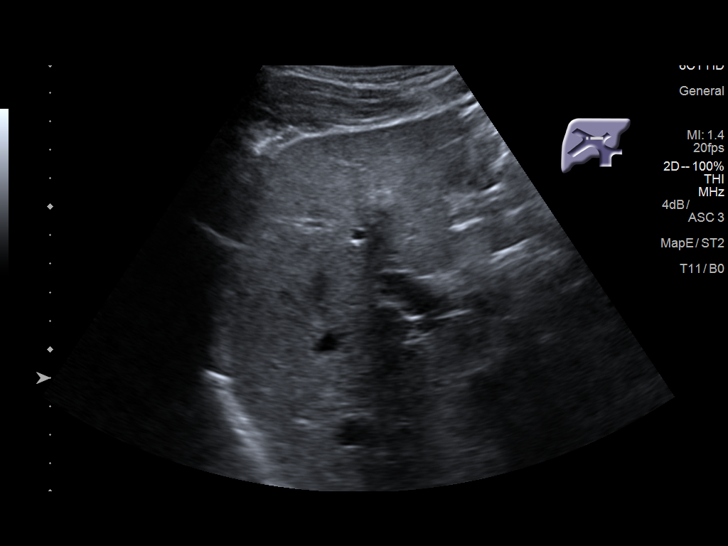
[im 50/55]
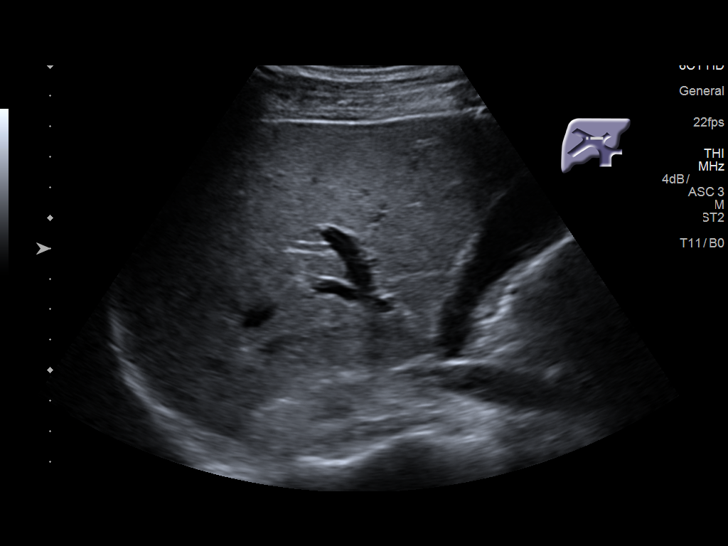
[im 55/55]
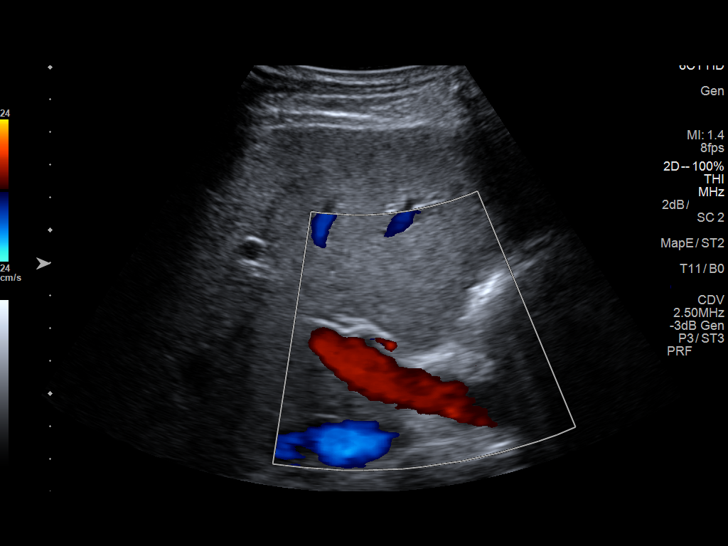

[14 of 25 positions shown; findings below may reference images not displayed]

FINDINGS: Gallbladder:

No gallstones or wall thickening visualized. No sonographic Murphy
sign noted by sonographer.

Common bile duct:

Diameter: 3 mm

Liver:

No focal lesion identified. Within normal limits in parenchymal
echogenicity. Portal vein is patent on color Doppler imaging with
normal direction of blood flow towards the liver.
IMPRESSION: Normal right upper quadrant ultrasound.  The gallbladder is normal.

## 2021-12-29 ENCOUNTER — Other Ambulatory Visit: Payer: Self-pay

## 2021-12-29 ENCOUNTER — Encounter (HOSPITAL_COMMUNITY): Payer: Self-pay

## 2021-12-29 ENCOUNTER — Emergency Department (HOSPITAL_COMMUNITY): Payer: Self-pay

## 2021-12-29 ENCOUNTER — Emergency Department (HOSPITAL_COMMUNITY)
Admission: EM | Admit: 2021-12-29 | Discharge: 2021-12-29 | Disposition: A | Discharge status: Home / Self Care | Payer: Self-pay | Attending: Emergency Medicine | Admitting: Emergency Medicine

## 2021-12-29 DIAGNOSIS — R202 Paresthesia of skin: Secondary | ICD-10-CM | POA: Insufficient documentation

## 2021-12-29 DIAGNOSIS — M25511 Pain in right shoulder: Secondary | ICD-10-CM | POA: Insufficient documentation

## 2021-12-29 DIAGNOSIS — Z7982 Long term (current) use of aspirin: Secondary | ICD-10-CM | POA: Insufficient documentation

## 2021-12-29 MED ORDER — METHOCARBAMOL 500 MG PO TABS: 500.0000 mg | ORAL_TABLET | Freq: Two times a day (BID) | ORAL | 0 refills | Status: AC | PRN

## 2021-12-29 MED ORDER — PREDNISONE 10 MG (21) PO TBPK: ORAL_TABLET | Freq: Every day | ORAL | 0 refills | Status: DC

## 2021-12-29 MED ORDER — IBUPROFEN 800 MG PO TABS: 800.0000 mg | ORAL_TABLET | Freq: Three times a day (TID) | ORAL | 0 refills | Status: DC | PRN

## 2021-12-29 NOTE — ED Triage Notes (Signed)
Pt states he started having right shoulder pain a month ago and pain has worsened. Pt states he is unable to pick up things w/right arm d/t pain. Pt has tingling in fingertips of right arm. 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

## 2021-12-29 NOTE — Discharge Instructions (Signed)
You are seen in the emergency department for evaluation of right shoulder pain.  Your x-ray did not show any obvious fracture or dislocation.  We are putting you on some pain and inflammation medications.  Also a muscle relaxant.  Please ice the area.  Follow-up with orthopedics.  Return if any worsening or concerning symptoms.

## 2021-12-29 NOTE — ED Provider Triage Note (Signed)
Emergency Medicine Provider Triage Evaluation Note  [name] , a 56 y.o. male  was evaluated in triage.  Pt complains of right shoulder pain.  He has been having right shoulder pain over the past month.  He feels like it is gotten a lot worse over the past 3 days.  It hurts when he moves his right shoulder.  He feels like he has shooting pains going from his right neck all the way down to his fingertips.  He has not had problems with the shoulder in the past. He denies chest pain, shortness of breath, nausea, or vomiting  Review of Systems  Positive: arthalgias Negative:   Physical Exam  BP (!) 141/101 (BP Location: Left Arm)   Pulse 87   Temp 98.8 F (37.1 C) (Oral)   Resp 16   SpO2 99%  Gen:   Awake, no distress   Resp:  Normal effort  MSK:   Moves extremities without difficulty  Other:  2+ radial pulse, sensation intact distally, reproducible pain to the right shoulder.  Extreme pain with range of motion of the right shoulder.  Medical Decision Making  Medically screening exam initiated at 10:03 AM.  Appropriate orders placed.  [name] was informed that the remainder of the evaluation will be completed by another provider, this initial triage assessment does not replace that evaluation, and the importance of remaining in the ED until their evaluation is complete.  Suspect MSK   Claudie Leach, PA-C 12/29/21 1004

## 2021-12-29 NOTE — ED Provider Notes (Signed)
[facility] EMERGENCY DEPARTMENT Provider Note   CSN: [id number] Arrival date & time: 12/29/21  1829     History  Chief Complaint  Patient presents with   Shoulder Pain    [name] is a 56 y.o. male.  He is right-hand dominant and works in [employer].  Complaining of right shoulder pain that is been going on for about 3 weeks.  No known trauma.  Associated with some tingling in his fourth and fifth fingers.  He rates the pain as severe and worse with movement.  He has been trying some Naprosyn without improvement.  No prior history of significant shoulder problems  The history is provided by the patient.  Shoulder Pain Location:  Shoulder Shoulder location:  R shoulder Injury: no   Pain details:    Quality:  Shooting and sharp   Radiates to:  R arm   Severity:  Severe   Onset quality:  Gradual   Duration:  3 weeks   Timing:  Intermittent   Progression:  Worsening Handedness:  Right-handed Dislocation: no   Relieved by:  Nothing Worsened by:  Movement Ineffective treatments:  NSAIDs Associated symptoms: neck pain, numbness and tingling   Associated symptoms: no back pain, no fever and no swelling        Home Medications Prior to Admission medications   Medication Sig Start Date End Date Taking? Authorizing Provider  aspirin 81 MG chewable tablet Chew 1 tablet (81 mg total) by mouth daily. 02/21/18   Arty Baumgartner, NP  omeprazole (PRILOSEC) 20 MG capsule Take 1 capsule (20 mg total) by mouth daily. 02/14/20   Tilden Fossa, MD      Allergies    Patient has no known allergies.    Review of Systems   Review of Systems  Constitutional:  Negative for fever.  HENT:  Negative for sore throat.   Respiratory:  Negative for shortness of breath.   Cardiovascular:  Negative for chest pain.  Gastrointestinal:  Negative for abdominal pain.  Genitourinary:  Negative for dysuria.  Musculoskeletal:  Positive for neck pain. Negative for back  pain.  Skin:  Negative for rash.  Neurological:  Positive for numbness. Negative for weakness.    Physical Exam Updated Vital Signs BP (!) 141/101 (BP Location: Left Arm)   Pulse 87   Temp 98.8 F (37.1 C) (Oral)   Resp 16   Ht 6\' 2"  (1.88 m)   Wt 90.7 kg   SpO2 99%   BMI 25.68 kg/m  Physical Exam Vitals and nursing note reviewed.  Constitutional:      General: He is not in acute distress.    Appearance: Normal appearance. He is well-developed.  HENT:     Head: Normocephalic and atraumatic.  Eyes:     Conjunctiva/sclera: Conjunctivae normal.  Cardiovascular:     Rate and Rhythm: Normal rate and regular rhythm.     Heart sounds: No murmur heard. Pulmonary:     Effort: Pulmonary effort is normal. No respiratory distress.     Breath sounds: Normal breath sounds.  Abdominal:     Palpations: Abdomen is soft.     Tenderness: There is no abdominal tenderness.  Musculoskeletal:        General: Tenderness present.     Cervical back: Neck supple.     Comments: He has diffuse tenderness around his right shoulder.  There is normal passive internal/external rotation.  Elbow wrist nontender.  Distal pulses motor and sensation intact.  There  is no midline cervical tenderness although does have some trapezius tenderness.  Skin:    General: Skin is warm and dry.     Capillary Refill: Capillary refill takes less than 2 seconds.  Neurological:     General: No focal deficit present.     Mental Status: He is alert.     Sensory: No sensory deficit.     Motor: No weakness.     ED Results / Procedures / Treatments   Labs (all labs ordered are listed, but only abnormal results are displayed) Labs Reviewed - No data to display  EKG None  Radiology DG Shoulder Right  Result Date: 12/29/2021 CLINICAL DATA:  shoulder pain EXAM: RIGHT SHOULDER - 2+ VIEW COMPARISON:  None Available. FINDINGS: There is no evidence of fracture or dislocation. There is no evidence of arthropathy or other  focal bone abnormality. Soft tissues are unremarkable. IMPRESSION: Negative. Electronically Signed   By: Marjo Bicker M.D.   On: 12/29/2021 10:32    Procedures Procedures    Medications Ordered in ED Medications - No data to display  ED Course/ Medical Decision Making/ A&P                           Medical Decision Making Risk Prescription drug management.   56 year old male with atraumatic right shoulder pain and some paresthesias.  Diffusely tender about his right shoulder.  No evidence of fracture or dislocation on physical exam or imaging.  Will treat symptomatically with NSAIDs and pain medication.  Given contact information for orthopedics follow-up.  Return instructions discussed        Final Clinical Impression(s) / ED Diagnoses Final diagnoses:  Acute pain of right shoulder    Rx / DC Orders ED Discharge Orders          Ordered    ibuprofen (ADVIL) 800 MG tablet  Every 8 hours PRN        12/29/21 1150    methocarbamol (ROBAXIN) 500 MG tablet  2 times daily PRN        12/29/21 1150    predniSONE (STERAPRED UNI-PAK 21 TAB) 10 MG (21) TBPK tablet  Daily        12/29/21 1150              Terrilee Files, MD 12/29/21 1834

## 2022-06-15 IMAGING — DX DG CHEST 1V PORT
1 series · 2 of 2 positions shown · non-contrast
Comparison: Chest radiograph dated 02/19/2018

CLINICAL DATA: Chest pain

EXAM:
PORTABLE CHEST 1 VIEW

[Series 1: chest · 0.14mm/px · 2 of 2 slices shown]
[im 1/2]
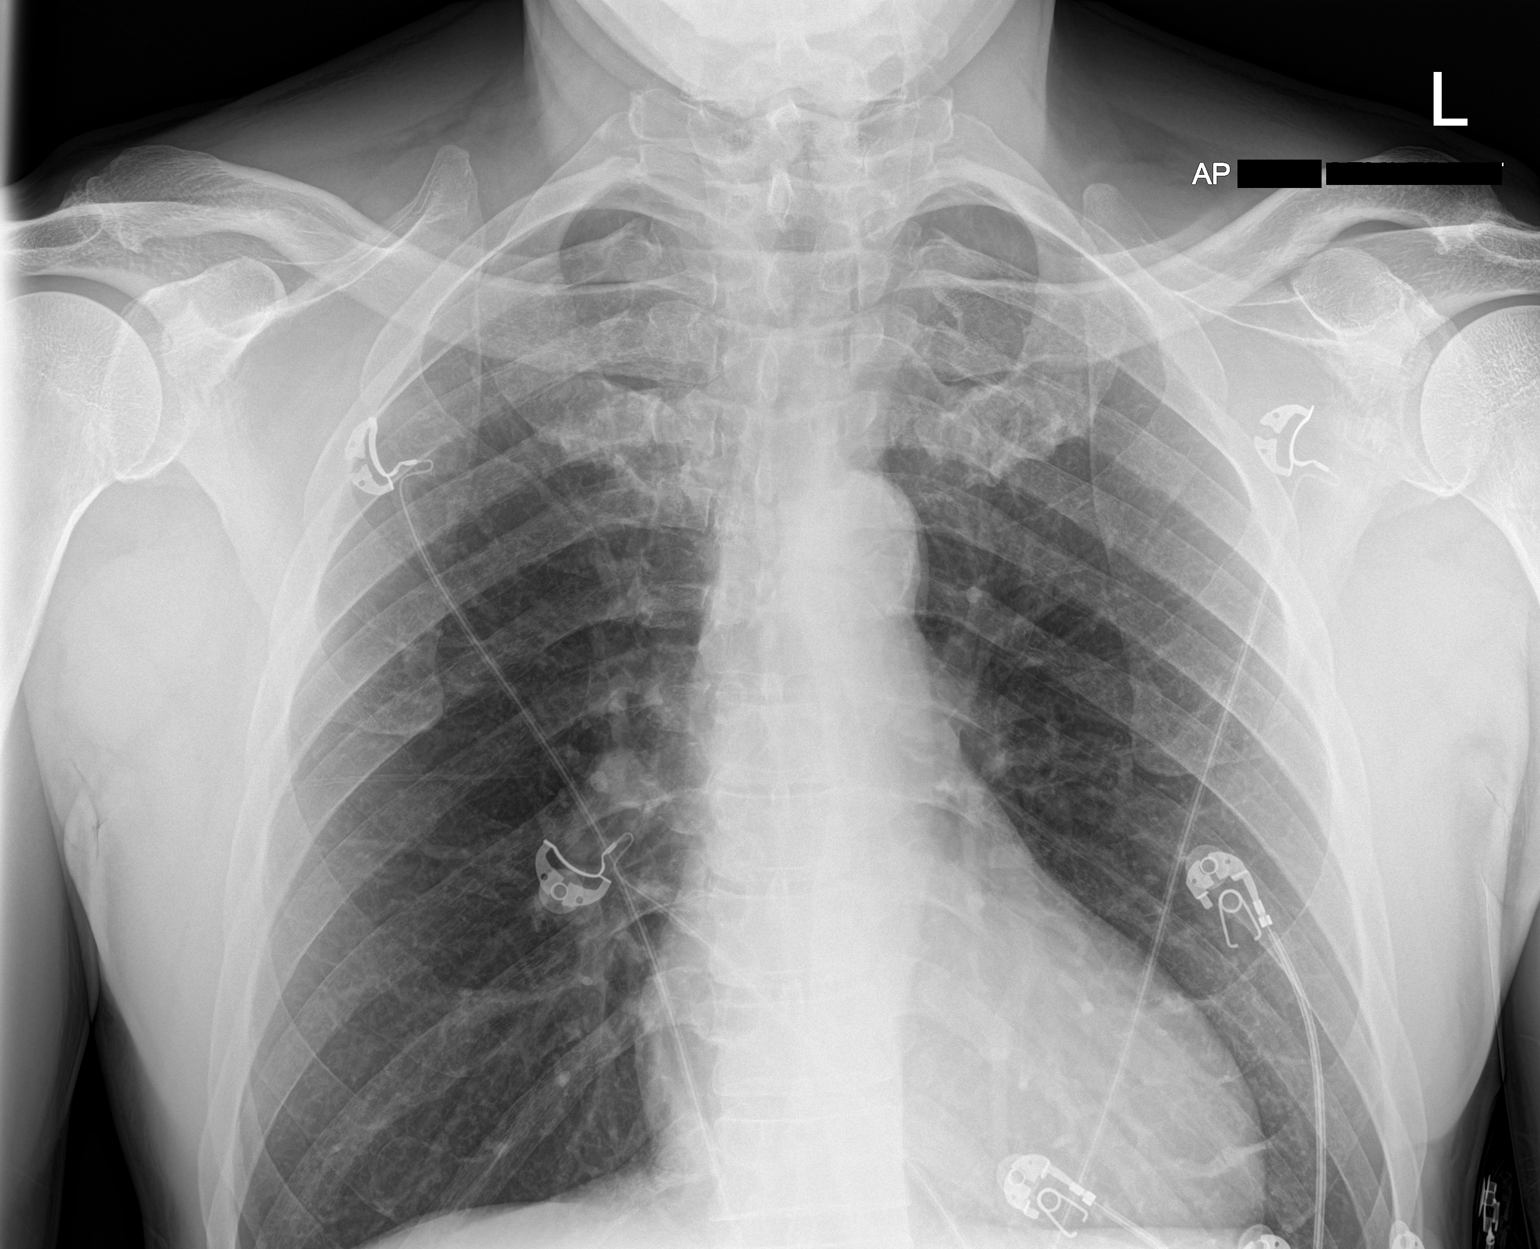
[im 2/2]
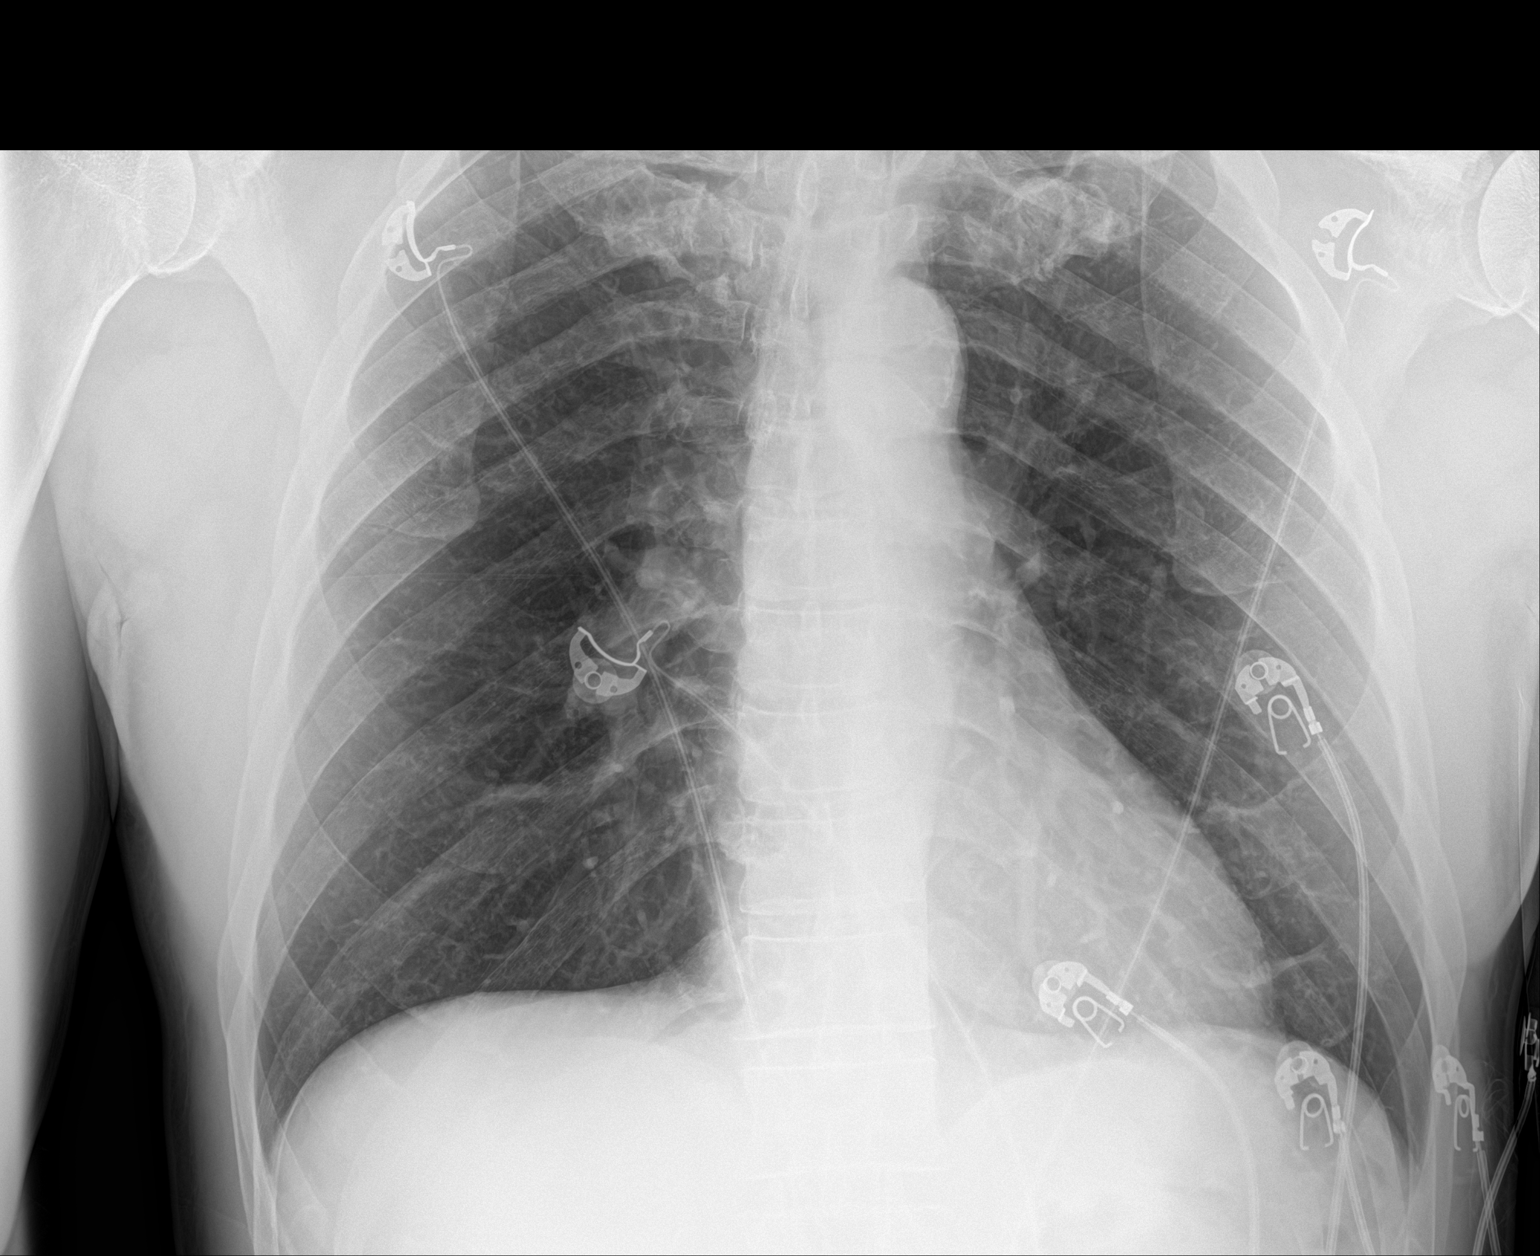

[2 of 2 positions shown; findings below may reference images not displayed]

FINDINGS: The heart size is normal. Vascular calcifications are seen in the
aortic arch. Both lungs are clear. The visualized skeletal
structures are unremarkable.
IMPRESSION: No active disease.

Aortic Atherosclerosis (BPCLA-OXC.C).

## 2023-02-06 ENCOUNTER — Other Ambulatory Visit: Payer: Self-pay

## 2023-02-06 ENCOUNTER — Encounter (HOSPITAL_COMMUNITY): Payer: Self-pay

## 2023-02-06 ENCOUNTER — Emergency Department (HOSPITAL_COMMUNITY): Payer: Self-pay

## 2023-02-06 ENCOUNTER — Emergency Department (HOSPITAL_COMMUNITY)
Admission: EM | Admit: 2023-02-06 | Discharge: 2023-02-06 | Discharge status: Against Medical Advice | Payer: Self-pay | Attending: Emergency Medicine | Admitting: Emergency Medicine

## 2023-02-06 DIAGNOSIS — R079 Chest pain, unspecified: Secondary | ICD-10-CM | POA: Insufficient documentation

## 2023-02-06 DIAGNOSIS — Z5321 Procedure and treatment not carried out due to patient leaving prior to being seen by health care provider: Secondary | ICD-10-CM | POA: Insufficient documentation

## 2023-02-06 DIAGNOSIS — R030 Elevated blood-pressure reading, without diagnosis of hypertension: Secondary | ICD-10-CM | POA: Insufficient documentation

## 2023-02-06 DIAGNOSIS — R42 Dizziness and giddiness: Secondary | ICD-10-CM | POA: Insufficient documentation

## 2023-02-06 LAB — CBC
HCT: 45.4 % (ref 39.0–52.0)
Hemoglobin: 14.8 g/dL (ref 13.0–17.0)
MCH: 30 pg (ref 26.0–34.0)
MCHC: 32.6 g/dL (ref 30.0–36.0)
MCV: 91.9 fL (ref 80.0–100.0)
Platelets: 355 10*3/uL (ref 150–400)
RBC: 4.94 MIL/uL (ref 4.22–5.81)
RDW: 14.5 % (ref 11.5–15.5)
WBC: 7.7 10*3/uL (ref 4.0–10.5)
nRBC: 0 % (ref 0.0–0.2)

## 2023-02-06 LAB — BASIC METABOLIC PANEL
Anion gap: 10 (ref 5–15)
BUN: 9 mg/dL (ref 6–20)
CO2: 22 mmol/L (ref 22–32)
Calcium: 9.4 mg/dL (ref 8.9–10.3)
Chloride: 105 mmol/L (ref 98–111)
Creatinine, Ser: 0.9 mg/dL (ref 0.61–1.24)
GFR, Estimated: >60 mL/min (ref >60)
Glucose, Bld: 92 mg/dL (ref 70–99)
Potassium: 3.8 mmol/L (ref 3.5–5.1)
Sodium: 137 mmol/L (ref 135–145)

## 2023-02-06 LAB — TROPONIN I (HIGH SENSITIVITY): Troponin I (High Sensitivity): 8 ng/L (ref <18)

## 2023-02-06 NOTE — ED Notes (Signed)
Called pt no answer °

## 2023-02-06 NOTE — ED Triage Notes (Addendum)
Pt c/o HTN. Pt states it was 171/103 at home today. Pt c/o dizziness. Pt denies HA or vision changes. Pt states went to PCP and they prescribed medicine, but he is unable to pick it up due to not having the money

## 2023-02-06 NOTE — ED Provider Triage Note (Signed)
Emergency Medicine Provider Triage Evaluation Note  [name] , a 57 y.o. male  was evaluated in triage.  Pt complains of concerns for elevated blood pressure.  Notes blood pressure at home was 171/103 today.  Has associated dizziness and left-sided chest pain.  Patient went to the primary care provider and was prescribed medication however was unable to pick up the medication due to financial concerns.  Denies shortness of breath, headache, vision changes, numbness, tingling.  Per patient chart review: Patient was evaluated by the primary care provider and sent prescription for amlodipine 5 mg.  Review of Systems  Positive:  Negative:   Physical Exam  BP (!) 140/96   Pulse 86   Temp 98.4 F (36.9 C) (Oral)   Resp 16   Ht 6\' 2"  (1.88 m)   Wt 90.7 kg   SpO2 97%   BMI 25.67 kg/m  Gen:   Awake, no distress   Resp:  Normal effort  MSK:   Moves extremities without difficulty  Other:  No chest wall tenderness to palpation.  No abdominal tenderness to palpation  Medical Decision Making  Medically screening exam initiated at 4:15 PM.  Appropriate orders placed.  [name] was informed that the remainder of the evaluation will be completed by another provider, this initial triage assessment does not replace that evaluation, and the importance of remaining in the ED until their evaluation is complete.  Workup initiated   Terrianna Holsclaw A, PA-C 02/06/23 1620

## 2023-02-27 ENCOUNTER — Ambulatory Visit (HOSPITAL_COMMUNITY)
Admission: EM | Admit: 2023-02-27 | Discharge: 2023-02-27 | Disposition: A | Discharge status: Home / Self Care | Payer: No Typology Code available for payment source

## 2023-02-27 ENCOUNTER — Encounter (HOSPITAL_COMMUNITY): Payer: Self-pay

## 2023-02-27 DIAGNOSIS — K0889 Other specified disorders of teeth and supporting structures: Secondary | ICD-10-CM | POA: Diagnosis not present

## 2023-02-27 MED ORDER — IBUPROFEN 800 MG PO TABS: 800.0000 mg | ORAL_TABLET | Freq: Three times a day (TID) | ORAL | 0 refills | Status: AC | PRN

## 2023-02-27 MED ORDER — AMOXICILLIN-POT CLAVULANATE 875-125 MG PO TABS: 1.0000 | ORAL_TABLET | Freq: Two times a day (BID) | ORAL | 0 refills | Status: AC
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
## 2023-02-27 NOTE — Discharge Instructions (Signed)
Take the Augmentin as prescribed to treat dental infection.  You can continue ibuprofen 800 mg every 8 hours and Tylenol 500 mg to 1000 mg every 6 hours for pain.  Follow-up with a dentist; resources was provided today.

## 2023-02-27 NOTE — ED Triage Notes (Signed)
Here for dental pain and swelling x 3 days.

## 2024-04-23 ENCOUNTER — Ambulatory Visit: Admitting: Podiatry

## 2024-04-27 ENCOUNTER — Ambulatory Visit: Admitting: Podiatry

## 2024-04-30 ENCOUNTER — Ambulatory Visit: Admitting: Podiatry

## 2024-04-30 ENCOUNTER — Encounter: Payer: Self-pay | Admitting: Podiatry

## 2024-04-30 VITALS — Ht 74.0 in | Wt 200.0 lb

## 2024-04-30 DIAGNOSIS — M7751 Other enthesopathy of right foot: Secondary | ICD-10-CM | POA: Diagnosis not present

## 2024-04-30 DIAGNOSIS — M216X2 Other acquired deformities of left foot: Secondary | ICD-10-CM | POA: Diagnosis not present

## 2024-04-30 DIAGNOSIS — M216X1 Other acquired deformities of right foot: Secondary | ICD-10-CM

## 2024-04-30 NOTE — Progress Notes (Signed)
 Subjective:   Patient ID: [name], male   DOB: 58 y.o.   MRN: [id number]   HPI Patient presents with severe pain underneath his right first metatarsal head and chronic deformity and pain of both feet with significant flatfoot pain and history of discomfort.  Patient states this has been an ongoing process worse recently and especially over the last month.  Patient smokes periodic cigars tries to be active   Review of Systems  All other systems reviewed and are negative.       Objective:  Physical Exam Vitals and nursing note reviewed.  Constitutional:      Appearance: He is well-developed.  Pulmonary:     Effort: Pulmonary effort is normal.  Musculoskeletal:        General: Normal range of motion.  Skin:    General: Skin is warm.  Neurological:     Mental Status: He is alert.     Neurovascular status was found to be intact muscle strength is found to be adequate significant structural deformity both feet with flatfoot deformity and patient is noted subfirst metatarsal head right hip inflammation fluid around the capsule.  Patient has good digital perfusion well oriented x 3 but is having a lot of pain with walking bilateral     Assessment:  Inflammatory capsulitis of the first MPJ plantar right along with flatfoot deformity noted bilateral      Plan:  H&P conditions reviewed in sterile prep and injected the plantar capsule right 3 mg dexamethasone Kenalog 5 mg Xylocaine  discussed long-term orthotics due to foot structural issue and patient is evaluated by pedorthist who is in agreement and is casted for functional orthotic devices to help with congenital foot issues.  Patient will be seen back when ready earlier if needed with courtesy debridement done today signed visit

## 2024-05-18 ENCOUNTER — Encounter: Payer: Self-pay | Admitting: Podiatry

## 2024-05-18 ENCOUNTER — Ambulatory Visit (INDEPENDENT_AMBULATORY_CARE_PROVIDER_SITE_OTHER): Admitting: Podiatry

## 2024-05-18 ENCOUNTER — Ambulatory Visit

## 2024-05-18 DIAGNOSIS — M216X1 Other acquired deformities of right foot: Secondary | ICD-10-CM

## 2024-05-18 DIAGNOSIS — M7751 Other enthesopathy of right foot: Secondary | ICD-10-CM | POA: Diagnosis not present

## 2024-05-18 DIAGNOSIS — M216X2 Other acquired deformities of left foot: Secondary | ICD-10-CM

## 2024-05-18 NOTE — Progress Notes (Signed)
 Subjective:   Patient ID: [name], male   DOB: 58 y.o.   MRN: [id number]   HPI Patient states still getting a lot of pain in the bottom of his right foot there was concern for because it seemed to recently his toes for 5   ROS      Objective:  Physical Exam  Neurovascular status intact with inflammation pain plantar right first metatarsal head moderate fluid buildup around the area keratotic tissue formation no other pathology noted     Assessment:  Inflammatory capsulitis with keratotic lesion prominent bone structure     Plan:  H&P reviewed I did discuss his overall bone structure and we are hopeful for orthotics to be returned within the next couple days and I discussed them with him.  I do not recommend current treatment except for stiff bottom shoes with cushioning and patient will be seen back when orthotics return all questions answered today

## 2024-05-25 ENCOUNTER — Telehealth: Payer: Self-pay

## 2024-05-25 NOTE — Telephone Encounter (Signed)
 Called pt. Orthotics are in the office for pick-up.

## 2024-06-09 ENCOUNTER — Other Ambulatory Visit

## 2024-07-10 ENCOUNTER — Ambulatory Visit (INDEPENDENT_AMBULATORY_CARE_PROVIDER_SITE_OTHER): Admitting: Podiatrist

## 2024-07-10 DIAGNOSIS — M7751 Other enthesopathy of right foot: Secondary | ICD-10-CM

## 2024-07-10 DIAGNOSIS — M216X1 Other acquired deformities of right foot: Secondary | ICD-10-CM

## 2024-07-10 DIAGNOSIS — M216X2 Other acquired deformities of left foot: Secondary | ICD-10-CM

## 2024-07-10 NOTE — Progress Notes (Signed)

## 2024-07-18 ENCOUNTER — Encounter (HOSPITAL_COMMUNITY): Payer: Self-pay | Admitting: Emergency Medicine

## 2024-07-18 ENCOUNTER — Other Ambulatory Visit: Payer: Self-pay

## 2024-07-18 ENCOUNTER — Emergency Department (HOSPITAL_COMMUNITY)
Admission: EM | Admit: 2024-07-18 | Discharge: 2024-07-18 | Disposition: A | Discharge status: Home / Self Care | Attending: Emergency Medicine | Admitting: Emergency Medicine

## 2024-07-18 ENCOUNTER — Emergency Department (HOSPITAL_COMMUNITY)

## 2024-07-18 DIAGNOSIS — R079 Chest pain, unspecified: Secondary | ICD-10-CM

## 2024-07-18 DIAGNOSIS — Z7982 Long term (current) use of aspirin: Secondary | ICD-10-CM | POA: Insufficient documentation

## 2024-07-18 DIAGNOSIS — I10 Essential (primary) hypertension: Secondary | ICD-10-CM | POA: Diagnosis not present

## 2024-07-18 DIAGNOSIS — R0789 Other chest pain: Secondary | ICD-10-CM | POA: Diagnosis present

## 2024-07-18 DIAGNOSIS — Z79899 Other long term (current) drug therapy: Secondary | ICD-10-CM | POA: Diagnosis not present

## 2024-07-18 LAB — BASIC METABOLIC PANEL WITH GFR
Anion gap: 10 (ref 5–15)
BUN: 18 mg/dL (ref 6–20)
CO2: 25 mmol/L (ref 22–32)
Calcium: 9.1 mg/dL (ref 8.9–10.3)
Chloride: 106 mmol/L (ref 98–111)
Creatinine, Ser: 0.87 mg/dL (ref 0.61–1.24)
GFR, Estimated: >60 mL/min
Glucose, Bld: 101 mg/dL — ABNORMAL HIGH (ref 70–99)
Potassium: 3.7 mmol/L (ref 3.5–5.1)
Sodium: 140 mmol/L (ref 135–145)

## 2024-07-18 LAB — CBC
HCT: 41 % (ref 39.0–52.0)
Hemoglobin: 14 g/dL (ref 13.0–17.0)
MCH: 30.1 pg (ref 26.0–34.0)
MCHC: 34.1 g/dL (ref 30.0–36.0)
MCV: 88.2 fL (ref 80.0–100.0)
Platelets: 389 10*3/uL (ref 150–400)
RBC: 4.65 MIL/uL (ref 4.22–5.81)
RDW: 13.6 % (ref 11.5–15.5)
WBC: 6.9 10*3/uL (ref 4.0–10.5)
nRBC: 0 % (ref 0.0–0.2)

## 2024-07-18 LAB — TROPONIN T, HIGH SENSITIVITY
Troponin T High Sensitivity: 8 ng/L (ref 0–19)
Troponin T High Sensitivity: 8 ng/L (ref 0–19)

## 2024-07-18 NOTE — Discharge Instructions (Signed)
 Your test here are reassuring.  It is unlikely that this is a heart attack.  Please follow-up with your family doctor in the office.  Please return for worsening symptoms especially if they occur upon exertion or if you are coughing up blood or pass out.  Take 4 over the counter ibuprofen  tablets 3 times a day or 2 over-the-counter naproxen tablets twice a day for pain. Also take tylenol  1000mg (2 extra strength) four times a day.

## 2024-07-18 NOTE — ED Provider Notes (Signed)
" [place] [facility] AT [facility] Provider Note   CSN: [id number] Arrival date & time: 07/18/24  9348     Patient presents with: Chest Pain   [name] is a 59 y.o. male.   59 yo M with a chief complaints of chest pain.  This woke him up from sleep about 2 hours ago.  Nothing seems to make it better or worse.  Feels like an uncomfortable sensation to the left side of his chest.  Denies radiation denies trauma denies cough congestion or fever.  He tells me he never had anything like this before.  Felt fine yesterday.  Patient denies history of MI, denies hyperlipidemia diabetes or smoking.  Denies family history of MI.  Patient tells me that he was a regular smoker until a month ago.  He has a history of hypertension but is noncompliant with his medications.  Patient denies history of PE or DVT denies hemoptysis denies unilateral lower extremity edema denies recent surgery immobilization hospitalization testosterone use or history of cancer.     Chest Pain      Prior to Admission medications  Medication Sig Start Date End Date Taking? Authorizing Provider  amLODipine (NORVASC) 5 MG tablet Take 1 tablet by mouth daily. 02/05/23 05/18/24  [provider]  aspirin  81 MG chewable tablet Chew 1 tablet (81 mg total) by mouth daily. 02/21/18   Henry Manuelita NOVAK, NP  ibuprofen  (ADVIL ) 800 MG tablet Take 1 tablet (800 mg total) by mouth every 8 (eight) hours as needed. Take with food to prevent GI upset 02/27/23   Chandra Harlene LABOR, NP  methocarbamol  (ROBAXIN ) 500 MG tablet Take 1 tablet (500 mg total) by mouth 2 (two) times daily as needed for muscle spasms. 12/29/21   Towana Ozell BROCKS, MD  omeprazole  (PRILOSEC) 20 MG capsule Take 1 capsule (20 mg total) by mouth daily. 02/14/20   Griselda Norris, MD    Allergies: Patient has no known allergies.    Review of Systems  Cardiovascular:  Positive for chest pain.    Updated Vital Signs BP (!) 120/97    Pulse 80   Temp 98.7 F (37.1 C) (Oral)   Resp 17   Ht 6' 1 (1.854 m)   Wt 90.7 kg   SpO2 100%   BMI 26.39 kg/m   Physical Exam Vitals and nursing note reviewed.  Constitutional:      Appearance: He is well-developed.  HENT:     Head: Normocephalic and atraumatic.  Eyes:     Pupils: Pupils are equal, round, and reactive to light.  Neck:     Vascular: No JVD.  Cardiovascular:     Rate and Rhythm: Normal rate and regular rhythm.     Heart sounds: No murmur heard.    No friction rub. No gallop.  Pulmonary:     Effort: No respiratory distress.     Breath sounds: No wheezing.  Chest:     Chest wall: Tenderness present.     Comments: Palpation of the left anterior chest wall reproduces the patient discomfort Abdominal:     General: There is no distension.     Tenderness: There is no abdominal tenderness. There is no guarding or rebound.  Musculoskeletal:        General: Normal range of motion.     Cervical back: Normal range of motion and neck supple.  Skin:    Coloration: Skin is not pale.     Findings: No rash.  Neurological:     Mental Status: He is alert and oriented to person, place, and time.  Psychiatric:        Behavior: Behavior normal.     (all labs ordered are listed, but only abnormal results are displayed) Labs Reviewed  BASIC METABOLIC PANEL WITH GFR - Abnormal; Notable for the following components:      Result Value   Glucose, Bld 101 (*)    All other components within normal limits  CBC  TROPONIN T, HIGH SENSITIVITY  TROPONIN T, HIGH SENSITIVITY    EKG: EKG Interpretation Date/Time:  Saturday July 18 2024 07:01:09 EST Ventricular Rate:  75 PR Interval:  174 QRS Duration:  106 QT Interval:  399 QTC Calculation: 446 R Axis:   -71  Text Interpretation: Sinus rhythm Multiple premature complexes, vent & supraven Left anterior fascicular block Consider right ventricular hypertrophy Left ventricular hypertrophy No significant change since last  tracing Confirmed by Emil Share [phone] on 07/18/2024 7:02:58 AM  Radiology: [facility] Chest 2 View Result Date: 07/18/2024 EXAM: 2 VIEW(S) XRAY OF THE CHEST 07/18/2024 07:47:00 AM COMPARISON: 02/06/23. CLINICAL HISTORY: Chest pain. FINDINGS: LINES, TUBES AND DEVICES: EKG leads noted. LUNGS AND PLEURA: No focal pulmonary opacity. No pleural effusion. No pneumothorax. HEART AND MEDIASTINUM: No acute abnormality of the cardiac and mediastinal silhouettes. BONES AND SOFT TISSUES: No acute osseous abnormality. IMPRESSION: 1. No acute cardiopulmonary pathology. Electronically signed by: Waddell Calk MD 07/18/2024 08:40 AM EST RP Workstation: [id number]     Procedures   Medications Ordered in the ED - No data to display                                  Medical Decision Making Amount and/or Complexity of Data Reviewed Labs: ordered. Radiology: ordered.   59 yo M with a chief complaint of chest pain.  Atypical in nature and reproduced on exam.  Patient arrived within an hour or so of starting his symptoms.  Will obtain 2 troponins.  Chest x-ray blood work.  I feel the patient's symptoms are completely atypical of pulmonary embolism and no further workup is warranted.  2 troponins are negative.  No acute anemia no significant electrolyte abnormalities.  Chest x-ray on my independent or potation without focal infiltrate or pneumothorax.  Will treat as musculoskeletal.  PCP follow-up.  11:40 AM:  I have discussed the diagnosis/risks/treatment options with the patient.  Evaluation and diagnostic testing in the emergency department does not suggest an emergent condition requiring admission or immediate intervention beyond what has been performed at this time.  They will follow up with PCP. We also discussed returning to the ED immediately if new or worsening sx occur. We discussed the sx which are most concerning (e.g., sudden worsening pain, fever, inability to tolerate by mouth,exertional symptoms, hemoptysis,  syncope) that necessitate immediate return. Medications administered to the patient during their visit and any new prescriptions provided to the patient are listed below.  Medications given during this visit Medications - No data to display   The patient appears reasonably screen and/or stabilized for discharge and I doubt any other medical condition or other [facility] requiring further screening, evaluation, or treatment in the ED at this time prior to discharge.       Final diagnoses:  Nonspecific chest pain    ED Discharge Orders     None          Emil Share, DO  07/18/24 1140 ° °"

## 2024-07-18 NOTE — ED Triage Notes (Signed)
 BIB [facility] from home with c/o chest pain. Pt states that he woke up to a funny feeling and that historically the last time he had this feeling he ended up in the cath lab, without intervention. Unknown cardiac history. Pt given 324 mg Aspirin  and 0.4 mg Nitroglycerin  tab by EMS. Pt reports he still has funny feeling.   BP 136/92 HR 87 Spo2 100% RA CBG 107 18 g LFA
